# Patient Record
Sex: Female | Born: 1965 | Hispanic: Yes | Marital: Married | Smoking: Never smoker
Health system: Southern US, Community
[De-identification: ages and names within clinical notes are randomized; demographics above are authoritative.]

## PROBLEM LIST (undated history)

## (undated) DIAGNOSIS — I350 Nonrheumatic aortic (valve) stenosis: Secondary | ICD-10-CM

## (undated) DIAGNOSIS — Q244 Congenital subaortic stenosis: Secondary | ICD-10-CM

## (undated) DIAGNOSIS — I351 Nonrheumatic aortic (valve) insufficiency: Secondary | ICD-10-CM

## (undated) DIAGNOSIS — I4891 Unspecified atrial fibrillation: Secondary | ICD-10-CM

## (undated) DIAGNOSIS — I10 Essential (primary) hypertension: Secondary | ICD-10-CM

## (undated) HISTORY — DX: Nonrheumatic aortic (valve) insufficiency: I35.1

## (undated) HISTORY — DX: Congenital subaortic stenosis: Q24.4

## (undated) HISTORY — DX: Nonrheumatic aortic (valve) stenosis: I35.0

---

## 2015-05-27 ENCOUNTER — Emergency Department (HOSPITAL_COMMUNITY): Payer: Self-pay

## 2015-05-27 ENCOUNTER — Emergency Department (HOSPITAL_COMMUNITY)
Admission: EM | Admit: 2015-05-27 | Discharge: 2015-05-27 | Disposition: A | Discharge status: Home / Self Care | Payer: Self-pay | Attending: Emergency Medicine | Admitting: Emergency Medicine

## 2015-05-27 ENCOUNTER — Encounter (HOSPITAL_COMMUNITY): Payer: Self-pay | Admitting: Emergency Medicine

## 2015-05-27 DIAGNOSIS — I480 Paroxysmal atrial fibrillation: Secondary | ICD-10-CM

## 2015-05-27 DIAGNOSIS — I10 Essential (primary) hypertension: Secondary | ICD-10-CM

## 2015-05-27 DIAGNOSIS — Z79899 Other long term (current) drug therapy: Secondary | ICD-10-CM | POA: Insufficient documentation

## 2015-05-27 DIAGNOSIS — I4891 Unspecified atrial fibrillation: Secondary | ICD-10-CM

## 2015-05-27 HISTORY — DX: Essential (primary) hypertension: I10

## 2015-05-27 HISTORY — DX: Unspecified atrial fibrillation: I48.91

## 2015-05-27 LAB — I-STAT TROPONIN, ED: TROPONIN I, POC: 0.01 ng/mL (ref 0.00–0.08)

## 2015-05-27 LAB — URINALYSIS, ROUTINE W REFLEX MICROSCOPIC
BILIRUBIN URINE: NEGATIVE
Glucose, UA: NEGATIVE mg/dL
Ketones, ur: NEGATIVE mg/dL
Leukocytes, UA: NEGATIVE
NITRITE: NEGATIVE
PROTEIN: NEGATIVE mg/dL
SPECIFIC GRAVITY, URINE: 1.006 (ref 1.005–1.030)
pH: 7 (ref 5.0–8.0)

## 2015-05-27 LAB — CBC
HCT: 43.4 % (ref 36.0–46.0)
Hemoglobin: 14.5 g/dL (ref 12.0–15.0)
MCH: 28.4 pg (ref 26.0–34.0)
MCHC: 33.4 g/dL (ref 30.0–36.0)
MCV: 85.1 fL (ref 78.0–100.0)
Platelets: 242 10*3/uL (ref 150–400)
RBC: 5.1 MIL/uL (ref 3.87–5.11)
RDW: 13.1 % (ref 11.5–15.5)
WBC: 10.7 10*3/uL — AB (ref 4.0–10.5)

## 2015-05-27 LAB — I-STAT CHEM 8, ED
BUN: 14 mg/dL (ref 6–20)
CREATININE: 0.5 mg/dL (ref 0.44–1.00)
Calcium, Ion: 1.03 mmol/L — ABNORMAL LOW (ref 1.12–1.23)
Chloride: 104 mmol/L (ref 101–111)
GLUCOSE: 125 mg/dL — AB (ref 65–99)
HCT: 47 % — ABNORMAL HIGH (ref 36.0–46.0)
HEMOGLOBIN: 16 g/dL — AB (ref 12.0–15.0)
Potassium: 3.4 mmol/L — ABNORMAL LOW (ref 3.5–5.1)
Sodium: 141 mmol/L (ref 135–145)
TCO2: 22 mmol/L (ref 0–100)

## 2015-05-27 LAB — MAGNESIUM: Magnesium: 2.2 mg/dL (ref 1.7–2.4)

## 2015-05-27 LAB — BASIC METABOLIC PANEL
Anion gap: 12 (ref 5–15)
BUN: 11 mg/dL (ref 6–20)
CALCIUM: 8.4 mg/dL — AB (ref 8.9–10.3)
CO2: 22 mmol/L (ref 22–32)
CREATININE: 0.52 mg/dL (ref 0.44–1.00)
Chloride: 104 mmol/L (ref 101–111)
GFR calc Af Amer: >60 mL/min (ref >60)
Glucose, Bld: 127 mg/dL — ABNORMAL HIGH (ref 65–99)
POTASSIUM: 3.3 mmol/L — AB (ref 3.5–5.1)
SODIUM: 138 mmol/L (ref 135–145)

## 2015-05-27 LAB — URINE MICROSCOPIC-ADD ON

## 2015-05-27 LAB — TSH: TSH: 2.172 u[IU]/mL (ref 0.350–4.500)

## 2015-05-27 MED ORDER — POTASSIUM CHLORIDE CRYS ER 20 MEQ PO TBCR
40.0000 meq | EXTENDED_RELEASE_TABLET | Freq: Every day | ORAL | Status: DC
  Administered 2015-05-27: 40 meq via ORAL
  Filled 2015-05-27: qty 2

## 2015-05-27 MED ORDER — APIXABAN 5 MG PO TABS
5.0000 mg | ORAL_TABLET | Freq: Two times a day (BID) | ORAL | Status: DC
Start: 2015-05-27 — End: 2015-06-28

## 2015-05-27 MED ORDER — APIXABAN 5 MG PO TABS: 5.0000 mg | ORAL_TABLET | Freq: Two times a day (BID) | ORAL | Status: DC

## 2015-05-27 MED ORDER — POTASSIUM CHLORIDE CRYS ER 20 MEQ PO TBCR: 20.0000 meq | EXTENDED_RELEASE_TABLET | Freq: Every day | ORAL | Status: DC

## 2015-05-27 MED ORDER — DILTIAZEM HCL ER COATED BEADS 240 MG PO CP24: 240.0000 mg | ORAL_CAPSULE | Freq: Every day | ORAL | Status: DC

## 2015-05-27 MED ORDER — POTASSIUM CHLORIDE ER 10 MEQ PO TBCR
40.0000 meq | EXTENDED_RELEASE_TABLET | Freq: Once | ORAL | Status: AC
  Administered 2015-05-27: 40 meq via ORAL
  Filled 2015-05-27: qty 4

## 2015-05-27 MED ORDER — DILTIAZEM HCL 25 MG/5ML IV SOLN
15.0000 mg | Freq: Once | INTRAVENOUS | Status: AC
  Administered 2015-05-27: 15 mg via INTRAVENOUS

## 2015-05-27 MED ORDER — PROPOFOL 10 MG/ML IV BOLUS
0.5000 mg/kg | Freq: Once | INTRAVENOUS | Status: AC
  Administered 2015-05-27: 40 mg via INTRAVENOUS
  Filled 2015-05-27: qty 20

## 2015-05-27 MED ORDER — PROPOFOL 10 MG/ML IV BOLUS
INTRAVENOUS | Status: AC | PRN
  Administered 2015-05-27: 40 mg via INTRAVENOUS

## 2015-05-27 MED ORDER — DILTIAZEM HCL 100 MG IV SOLR
5.0000 mg/h | Freq: Once | INTRAVENOUS | Status: AC
  Administered 2015-05-27: 5 mg/h via INTRAVENOUS
  Filled 2015-05-27: qty 100

## 2015-05-27 MED ORDER — ENOXAPARIN SODIUM 80 MG/0.8ML ~~LOC~~ SOLN
70.0000 mg | Freq: Once | SUBCUTANEOUS | Status: AC
  Administered 2015-05-27: 70 mg via SUBCUTANEOUS
  Filled 2015-05-27: qty 0.8

## 2015-05-27 MED ORDER — METOPROLOL TARTRATE 1 MG/ML IV SOLN
5.0000 mg | Freq: Once | INTRAVENOUS | Status: AC
  Administered 2015-05-27: 5 mg via INTRAVENOUS
  Filled 2015-05-27: qty 5

## 2015-05-27 NOTE — ED Notes (Signed)
cardiology at bedside

## 2015-05-27 NOTE — Sedation Documentation (Signed)
Cardioverted at 150j by Dr. Oval Linsey cardiology. Pt tolerated well.

## 2015-05-27 NOTE — ED Notes (Signed)
Cardiology MD at bedside.

## 2015-05-27 NOTE — ED Notes (Signed)
Dr Dina Rich aware and at bedside for heart rate.

## 2015-05-27 NOTE — ED Provider Notes (Signed)
CSN: [id number]     Arrival date & time 05/27/15  0532 History   First MD Initiated Contact with Patient 05/27/15 0600     Chief Complaint  Patient presents with  . Chest Pain     (Consider location/radiation/quality/duration/timing/severity/associated sxs/prior Treatment) HPI  This is a 50 year old female with no reported past medical history who presents with chest pain and fullness in her throat. She also reports bilateral arm numbness. It was acute in onset at approximately 4 AM. No history of the same. She was noted in triage to have a heart rate in the 190s to 200s. She otherwise is hemodynamically stable. Blood pressure 166/144. Currently she feels like "I just have something in my throat." Denies any alcohol or drug use. Denies any excessive caffeine use. No history of coronary artery disease, diabetes, hypertension.  Level V caveat for acuity of condition  History reviewed. No pertinent past medical history. History reviewed. No pertinent past surgical history. No family history on file. Social History  Substance Use Topics  . Smoking status: Never Smoker   . Smokeless tobacco: None  . Alcohol Use: None   OB History    No data available     Review of Systems  Constitutional: Negative for fever.  Respiratory: Negative for shortness of breath.   Cardiovascular: Positive for chest pain. Negative for leg swelling.  Gastrointestinal: Negative for nausea, vomiting and abdominal pain.  All other systems reviewed and are negative.     Allergies  Review of patient's allergies indicates no known allergies.  Home Medications   Prior to Admission medications   Not on File   BP 130/104 mmHg  Pulse 104  Temp(Src) 97.7 F (36.5 C) (Oral)  Resp 25  SpO2 98% Physical Exam  Constitutional: She is oriented to person, place, and time. She appears well-developed and well-nourished. No distress.  HENT:  Head: Normocephalic and atraumatic.  Cardiovascular: Normal heart  sounds.   No murmur heard. Tachycardia, irregularly irregular  Pulmonary/Chest: Effort normal. No respiratory distress. She has no wheezes.  Abdominal: Soft. Bowel sounds are normal. There is no tenderness. There is no rebound.  Musculoskeletal: She exhibits no edema.  Neurological: She is alert and oriented to person, place, and time.  5 out of 5 strength bilateral upper and lower extremities, cranial nerves II through XII intact  Skin: Skin is warm and dry.  Psychiatric: She has a normal mood and affect.  Nursing note and vitals reviewed.   ED Course  Procedures (including critical care time)  CRITICAL CARE Performed by: Merryl Hacker   Total critical care time: 30 minutes  Critical care time was exclusive of separately billable procedures and treating other patients.  Critical care was necessary to treat or prevent imminent or life-threatening deterioration.  Critical care was time spent personally by me on the following activities: development of treatment plan with patient and/or surrogate as well as nursing, discussions with consultants, evaluation of patient's response to treatment, examination of patient, obtaining history from patient or surrogate, ordering and performing treatments and interventions, ordering and review of laboratory studies, ordering and review of radiographic studies, pulse oximetry and re-evaluation of patient's condition.  Labs Review Labs Reviewed  BASIC METABOLIC PANEL - Abnormal; Notable for the following:    Potassium 3.3 (*)    Glucose, Bld 127 (*)    Calcium 8.4 (*)    All other components within normal limits  CBC - Abnormal; Notable for the following:    WBC 10.7 (*)  All other components within normal limits  I-STAT CHEM 8, ED - Abnormal; Notable for the following:    Potassium 3.4 (*)    Glucose, Bld 125 (*)    Calcium, Ion 1.03 (*)    Hemoglobin 16.0 (*)    HCT 47.0 (*)    All other components within normal limits   MAGNESIUM  TSH  I-STAT TROPOININ, ED  Randolm Idol, ED    Imaging Review Dg Chest Portable 1 View  05/27/2015  CLINICAL DATA:  Chest pain and tachycardia.  Atrial fibrillation. EXAM: PORTABLE CHEST 1 VIEW COMPARISON:  None. FINDINGS: Shallow inspiration. EKG patch limits evaluation of the mediastinum. The heart size and mediastinal contours are within normal limits. Both lungs are clear. The visualized skeletal structures are unremarkable. IMPRESSION: No active disease. Electronically Signed   By: Lucienne Capers M.D.   On: 05/27/2015 06:33   I have personally reviewed and evaluated these images and lab results as part of my medical decision-making.   EKG Interpretation   Date/Time:  Friday May 27 2015 05:48:32 EDT Ventricular Rate:  186 PR Interval:    QRS Duration: 72 QT Interval:  226 QTC Calculation: 397 R Axis:   66 Text Interpretation:  Atrial fibrillation with rapid ventricular response  Marked ST abnormality, possible inferior subendocardial injury Marked ST  abnormality, possible anterior subendocardial injury Abnormal ECG  Confirmed by HORTON  MD, COURTNEY ([id number]) on 05/27/2015 6:00:39 AM      MDM   Final diagnoses:  Atrial fibrillation with rapid ventricular response [facility])    Patient presents in atrial fibrillation with RVR. Onset at approximately 4 AM. No known history. She is stable. Heart rates 180-200. She was given 15 mg of diltiazem with rates lowering into the 130s. Still in atrial fibrillation. She was placed on a diltiazem drip. Orders per atrial fibrillation pathway. Troponin is negative. Discussed with cardiology. They will advise regarding chemical versus electrical cardioversion.  Signed out to Dr. Ashok Cordia.    Merryl Hacker, MD 05/27/15 [phone]

## 2015-05-27 NOTE — Progress Notes (Signed)
Electrical Cardioversion Procedure Note Whitney Juarez [id number] 1966/02/04  Procedure: Electrical Cardioversion Indications:  Atrial Fibrillation  Procedure Details Consent: Risks of procedure as well as the alternatives and risks of each were explained to the (patient/caregiver).  Consent for procedure obtained. Time Out: Verified patient identification, verified procedure, site/side was marked, verified correct patient position, special equipment/implants available, medications/allergies/relevent history reviewed, required imaging and test results available.  Performed  Patient placed on cardiac monitor, pulse oximetry, supplemental oxygen as necessary.  Sedation given: Propofol via EM Attending Pacer pads placed anterior and posterior chest.  Cardioverted 1 time(s).  Cardioverted at 150J.  Evaluation Findings: Post procedure EKG shows: NSR Complications: None Patient did tolerate procedure well.   Skeet Latch , MD  05/27/2015, 10:19 AM

## 2015-05-27 NOTE — Consult Note (Addendum)
Patient ID: Whitney Juarez MRN: [id number] DOB/AGE: October 09, 1965 50 y.o.  Admit date: 05/27/2015 Primary Physician n/a Primary Cardiologist new Skeet Latch, MD)   Chief Complaint  Atrial fibrillation with rapid ventricular response.   HPI: Ms. Whitney Juarez is a 89F with hypertension who presents to the ED with atrial fibrillation with RVR.  On Wednesday afternoon she noted the sudden onset of slight chest discomfort.  Since that time she has noted mild shortness of breath with ambulation but denied palpitations.  This am she awakened at 4 am with palpitations and worsened chest discomfort, diaphoresis, and mild shortness of breath.  She denies lightheadedness or dizziness.  She also denies lower extremity edema, orthopnea or PND.  Troponin was 0.01.  She was started on a diltiazem infusion and her heart rates lowered to the 110s-140s.  She reports that her chest pressure is improved but still present.  She also continues to have palpitations.    Ms. Whitney Juarez reports a history of hypertension but has not been on any medications.  She does not have a primary care physician and does not have regular medical care.    Review of Systems: A 12 point review of systems was obtained and was negative with exceptions as noted in the HPI.  History reviewed. No pertinent past medical history.   (Not in a hospital admission)  Infusions:   No Known Allergies  Social History   Social History  . Marital Status: Married    Spouse Name: N/A  . Number of Children: N/A  . Years of Education: N/A   Occupational History  . Not on file.   Social History Main Topics  . Smoking status: Never Smoker   . Smokeless tobacco: Not on file  . Alcohol Use: Not on file  . Drug Use: Not on file  . Sexual Activity: Not on file   Other Topics Concern  . Not on file   Social History Narrative  . No narrative on file    No family history on file.  PHYSICAL EXAM: Filed Vitals:   05/27/15 0630 05/27/15 0715    BP: 138/89 130/104  Pulse: 125 104  Temp:    Resp: 24 25    No intake or output data in the 24 hours ending 05/27/15 0821  General:  Well appearing. No respiratory difficulty HEENT: normal Neck: supple. no JVD. Carotids 2+ bilat; no bruits. No lymphadenopathy or thryomegaly appreciated. Cor: PMI nondisplaced. Irregularly irregular.  No rubs, gallops or murmurs. Lungs: clear to auscultation bilaterally. Abdomen: soft, nontender, nondistended. No hepatosplenomegaly. No bruits or masses. Good bowel sounds. Extremities: no cyanosis, clubbing, rash, edema Neuro: alert & oriented x 3, cranial nerves grossly intact. moves all 4 extremities w/o difficulty. Affect pleasant.  Results for orders placed or performed during the hospital encounter of 05/27/15 (from the past 24 hour(s))  I-Stat Troponin, ED (not at [facility])     Status: None   Collection Time: 05/27/15  6:08 AM  Result Value Ref Range   Troponin i, poc 0.01 0.00 - 0.08 ng/mL   Comment 3          I-Stat Chem 8, ED     Status: Abnormal   Collection Time: 05/27/15  6:11 AM  Result Value Ref Range   Sodium 141 135 - 145 mmol/L   Potassium 3.4 (L) 3.5 - 5.1 mmol/L   Chloride 104 101 - 111 mmol/L   BUN 14 6 - 20 mg/dL   Creatinine, Ser 0.50 0.44 - 1.00  mg/dL   Glucose, Bld 125 (H) 65 - 99 mg/dL   Calcium, Ion 1.03 (L) 1.12 - 1.23 mmol/L   TCO2 22 0 - 100 mmol/L   Hemoglobin 16.0 (H) 12.0 - 15.0 g/dL   HCT 47.0 (H) 36.0 - 46.0 %  TSH     Status: None   Collection Time: 05/27/15  6:15 AM  Result Value Ref Range   TSH 2.172 0.350 - 4.500 uIU/mL  Basic metabolic panel     Status: Abnormal   Collection Time: 05/27/15  6:38 AM  Result Value Ref Range   Sodium 138 135 - 145 mmol/L   Potassium 3.3 (L) 3.5 - 5.1 mmol/L   Chloride 104 101 - 111 mmol/L   CO2 22 22 - 32 mmol/L   Glucose, Bld 127 (H) 65 - 99 mg/dL   BUN 11 6 - 20 mg/dL   Creatinine, Ser 0.52 0.44 - 1.00 mg/dL   Calcium 8.4 (L) 8.9 - 10.3 mg/dL   GFR calc non Af Amer  >60 >60 mL/min   GFR calc Af Amer >60 >60 mL/min   Anion gap 12 5 - 15  CBC     Status: Abnormal   Collection Time: 05/27/15  6:38 AM  Result Value Ref Range   WBC 10.7 (H) 4.0 - 10.5 K/uL   RBC 5.10 3.87 - 5.11 MIL/uL   Hemoglobin 14.5 12.0 - 15.0 g/dL   HCT 43.4 36.0 - 46.0 %   MCV 85.1 78.0 - 100.0 fL   MCH 28.4 26.0 - 34.0 pg   MCHC 33.4 30.0 - 36.0 g/dL   RDW 13.1 11.5 - 15.5 %   Platelets 242 150 - 400 K/uL  Magnesium     Status: None   Collection Time: 05/27/15  6:38 AM  Result Value Ref Range   Magnesium 2.2 1.7 - 2.4 mg/dL   Dg Chest Portable 1 View  05/27/2015  CLINICAL DATA:  Chest pain and tachycardia.  Atrial fibrillation. EXAM: PORTABLE CHEST 1 VIEW COMPARISON:  None. FINDINGS: Shallow inspiration. EKG patch limits evaluation of the mediastinum. The heart size and mediastinal contours are within normal limits. Both lungs are clear. The visualized skeletal structures are unremarkable. IMPRESSION: No active disease. Electronically Signed   By: Lucienne Capers M.D.   On: 05/27/2015 06:33    ECG: Atrial fibrillation rate 186. Inferolateral ST or pressure and T-wave inversion.   ASSESSMENT/PLAN:  # Paroxysmal atrial fibrillation with RVR: Ms. Whitney Juarez is here with new-onset atrial fibrillation.  She has a clear onset of either this AM at 4 am or possibly slower rates on Wednesday afternoon.  Either way, we are within the 48h window that is acceptable for DCCV without TEE.  We will give her a dose of weight-based lovenox this AM with plans to start Eliquis today.  TSH is normal and she is not anemic.  We will supplement potassium.  If she remains in sinus rhythm post DCCV, we will start diltiazem CD 240mg  daily and Eliquis 5mg  bid.  We will also give potassium 20 mEq po daily.  We will schedule follow up in atrial fibrillation clinic within 1 week.  She will need an echo as an outpatient.  This patients CHA2DS2-VASc Score and unadjusted Ischemic Stroke Rate (% per year) is equal to  2.2 % stroke rate/year from a score of 2  Above score calculated as 1 point each if present [CHF, HTN, DM, Vascular=MI/PAD/Aortic Plaque, Age if 65-74, or Female] Above score calculated as 2 points  each if present [Age > 75, or Stroke/TIA/TE]    # Hypertension:  Ms. Whitney Juarez has hypertension that is not treated.  We will start diltiazem as above.  Her blood pressure is well-controlled on the diltiazem infusion.  # Leukocytosis: Ms. Whitney Juarez does not have any infectious symptoms.  We will add on a U/A.   Signed: Nesiah Jump C. Oval Linsey, MD, [facility]  05/27/2015, 8:21 AM

## 2015-05-27 NOTE — Discharge Instructions (Signed)
It was our pleasure to provide your ER care today - we hope that you feel better.  Take eliquis (blood thinner medication) as prescribed. Use caution to avoid falling when taking blood thinner medication.  Take diltiazem (blood pressure and heart rate control medication) as prescribed.  Your potassium level is mildly low (3.3) - eat plenty of fruits and vegetables, take potassium supplement as prescribed, and follow up with your doctor for recheck in 1 week.  Follow up in the [facility] in the coming week.   Return to ER if worse, new symptoms, chest pain, trouble breathing, persistent fast heart beat, weak/fainting, other concern.  You were given sedating medication in the ER - no driving for the next 12 hours.     Fibrilacin auricular (Atrial Fibrillation) La fibrilacin auricular es un tipo de latido cardaco irregular o rpido (arritmia). En la fibrilacin auricular, el corazn tiembla continuamente en un patrn catico. Esto ocurre cuando regiones del corazn reciben seales desorganizadas que le impiden a este rgano bombear con normalidad. Esto puede incrementar el riesgo de ictus, insuficiencia cardaca y otras enfermedades relacionadas con el corazn. Hay diferentes tipos de fibrilacin auricular, entre ellos:  Fibrilacin auricular paroxstica. Este tipo de fibrilacin se presenta de Geographical information systems officer repentina y suele detenerse por s sola poco tiempo despus de haber comenzado.  Fibrilacin auricular persistente. Este tipo suele durar ms de [employer]. Puede detenerse por s sola o con tratamiento.  Fibrilacin auricular persistente de larga duracin. Este tipo dura ms de 91meses.  Fibrilacin auricular permanente. Este tipo no desaparece. Hable con el mdico para saber qu tipo de fibrilacin auricular es la que usted tiene. CAUSAS Las causas de esta afeccin son algunas enfermedades o procedimientos que guardan relacin con el corazn, e incluyen lo  siguiente:  Infarto de miocardio.  Enfermedad arterial coronaria.  Insuficiencia cardaca.  [place].  Hipertensin arterial.  Inflamacin de la membrana que rodea el corazn (pericarditis).  Ciruga cardaca.  Algunos tipos de trastornos del ritmo cardaco, como sndrome de Wolff-Parkinson-White. Algunas otras causas son las siguientes:  Neumona.  Tiene apnea obstructiva del sueo.  Obstruccin de una arteria en los pulmones (embolia pulmonar o EP).  Cncer de pulmn.  Enfermedad pulmonar crnica.  Problemas tiroideos, especialmente si la tiroides es hiperactiva (hipertiroidismo).  Cafena.  Consumo excesivo de alcohol o de drogas.  Uso de algunos medicamentos, incluidos ciertos descongestivos y pastillas para Horticulturist, commercial. A veces, la causa no se puede determinar. FACTORES DE RIESGO Es ms probable que esta afeccin se manifieste en:  [employer].  Los fumadores.  Las personas que tienen diabetes mellitus.  Las personas que tienen sobrepeso (obesidad).  Los atletas que [employer]. SNTOMAS Los sntomas de esta afeccin incluyen lo siguiente:  Sensacin de que el corazn late rpida o irregularmente.  Sensacin de Ecologist.  Falta de aire.  Sensacin repentina de desvanecimiento o debilidad.  Cansarse con facilidad durante la actividad fsica. En algunos casos no hay sntomas. DIAGNSTICO El mdico puede detectar la fibrilacin auricular al tomarle el pulso. Si se la detecta, esta afeccin se puede diagnosticar a travs de lo siguiente:  Un electrocardiograma (ECG).  Un estudio con monitor de Holter que [employer] patrones de los latidos cardacos durante un lapso de 24horas.  Un ecocardiograma transtorcico (ETT) para evaluar la circulacin de sangre en el corazn.  Un ecocardiograma transesofgico (ETE) para ver imgenes ms detalladas del corazn.  Una prueba de  esfuerzo.  Estudios de diagnstico por imgenes, como una tomografa computarizada (TC) o una radiografa de trax.  Anlisis de [place]. TRATAMIENTO Los principales objetivos del tratamiento son evitar la formacin de cogulos sanguneos y Theatre manager el corazn latiendo a una frecuencia y un ritmo normales. El tipo de tratamiento que se administra depende de muchos factores, por ejemplo, las enfermedades preexistentes y cmo se siente cuando tiene fibrilacin auricular. El tratamiento de esta afeccin puede incluir lo siguiente:  Medicamentos para Health and safety inspector frecuencia cardaca, normalizar el ritmo cardaco o evitar la formacin de cogulos sanguneos.  Cardioversin elctrica. Se trata de un procedimiento que restablece el ritmo cardaco al aplicar una descarga elctrica controlada de baja intensidad en el corazn a travs de la piel.  Diferentes tipos de ablacin, como la ablacin por catter, la ablacin por catter con marcapasos o la ablacin [place]. Estos procedimientos destruyen los tejidos del corazn que envan las seales anormales. Cuando se [place] un marcapasos, se lo coloca debajo de la piel para ayudar a que el corazn lata a un ritmo constante. Aurora los medicamentos de venta libre y los recetados solamente como se lo haya indicado el mdico.  Si el mdico le recet un anticoagulante, tmelo exactamente como se lo indic. Tomar dosis excesivas de anticoagulantes puede causar hemorragias. Si no toma las dosis suficientes de anticoagulantes, no tendr la proteccin necesaria contra el ictus y Educational psychologist.  No consuma productos que contengan tabaco, incluidos cigarrillos, tabaco de Higher education careers adviser y Psychologist, sport and exercise. Si necesita ayuda para dejar de fumar, consulte al mdico.  Si tiene apnea obstructiva del sueo, trate la afeccin como se lo haya indicado el mdico.  No beba alcohol.  No tome bebidas que contengan cafena, como  caf, refrescos y t.  Mantenga un peso saludable. No tome pastillas para adelgazar a menos que el mdico lo autorice. Estas pueden agravar los problemas cardacos.  Siga las indicaciones del mdico con respecto a Engineer, technical sales.  Haga actividad fsica habitualmente como se lo haya indicado el mdico.  Concurra a todas las visitas de control como se lo haya indicado el mdico. Esto es importante. PREVENCIN  No tome bebidas que contengan cafena o alcohol.  Evite tomar determinados medicamentos, especialmente los que se usan para los problemas respiratorios.  Evite tomar determinadas hierbas y [employer], por ejemplo, los que contienen efedra o ginseng.  No consuma drogas, como cocana y anfetaminas.  No fume.  Mantenga la hipertensin arterial bajo control. SOLICITE ATENCIN MDICA SI:  Nota un cambio en la frecuencia, el ritmo o la fuerza de los latidos cardacos.  Est tomando un anticoagulante y Cathleen Fears mayor cantidad de hematomas.  Se cansa ms fcilmente cuando hace actividad fsica o esfuerzos. SOLICITE ATENCIN MDICA DE INMEDIATO SI:  Tiene dolor de pecho, dolor abdominal, sudoracin o debilidad.  Siente nuseas.  Observa sangre en el vmito, en las heces o en la orina.  Le falta el aire.  Se le hinchan los pies y los tobillos repentinamente.  Siente mareos.  Siente debilidad o adormecimiento sbito de la cara, el brazo o la pierna, especialmente en un lado del cuerpo.  Tiene dificultad para hablar, para comprender o para ambas cosas (afasia).  El rostro o el prpado se le caen hacia un lado. Estos sntomas pueden representar un problema grave que constituye Engineer, maintenance (IT). No espere hasta que los sntomas desaparezcan. Solicite atencin mdica de inmediato. Comunquese con el servicio de emergencias de su localidad (911 en los [place]). No conduzca por sus propios medios [employer].   Esta informacin no tiene Marine scientist el  consejo del mdico. Asegrese de hacerle al mdico cualquier pregunta que tenga.   Document Released: 01/22/2005 Document Revised: 10/13/2014 Elsevier Interactive Patient Education 2016 [place] (Hypokalemia) Hipokalemia significa que el nivel de potasio en sangre es menor que lo normal. El potasio es un electrolito que ayuda a regular la cantidad de lquido del organismo. Tambin estimula la contraccin muscular y ayuda a que la funcin muscular sea la [place]. La Delorise Shiner del potasio del organismo se encuentra dentro de las clulas y slo una pequea cantidad en la sangre. Debido a que la cantidad en la sangre es muy pequea, pequeos cambios en la sangre pueden poner en peligro la vida. CAUSAS  Antibiticos.  Diarrea o vmitos.  El uso excesivo de laxantes, lo que puede causar diarrea.  Enfermedad renal crnica.  Uso de diurticos.  Trastornos de Youth worker (bulimia).  Bajos niveles de magnesio.  Sudoracin abundante. Excel.  Estreimiento.  Fatiga.  Calambres musculares.  Confusin mental.  Latidos cardacos salteados o irregulares (palpitaciones).  Hormigueo o adormecimiento. DIAGNSTICO  El mdico puede diagnosticar hipokalemia por los anlisis de [place]. Adems para controlar sus niveles de potasio, el mdico podr ordenar otros anlisis de laboratorio. TRATAMIENTO La hipokalemia puede tratarse con suplementos de potasio por va oral o realizando ajustes en sus medicamentos habituales. Si sus niveles de potasio son muy bajos, ser necesario que lo reciba a travs de una vena (IV) y se lo controle en el hospital. Ardelia Mems dieta rica en potasio tambin puede ser de [place]. Los alimentos ricos en potasio son:  Clayburn Pert secos, como cacahuetes y pistachos.  Semillas, como semillas de girasol y de Scientific laboratory technician.  Porotos, guisantes secos y lentejas.  Granos enteros y panes y cereales con salvado.  Lambert Mody y vegetales frescos  como damascos, avocado, bananas, meln, kiwi, naranjas, esprragos y patatas.  Jugos de naranja y tomates.  Carnes rojas.  Yogur con frutas. INSTRUCCIONES PARA EL CUIDADO EN EL HOGAR  Tome todos los [employer] indic el mdico.  Siga una dieta saludable e incluya alimentos nutritivos como frutas, vegetales, nueces, granos enteros y carnes [place].  Si est tomando laxantes, asegrese de seguir las instrucciones del envase. SOLICITE ATENCIN MDICA SI:  La debilidad empeora.  Siente que el corazn late fuerte o est acelerado.  Vomita o tiene diarrea.  Tiene problemas para mantener su nivel de glucosa en el rango normal. SOLICITE ATENCIN MDICA DE INMEDIATO SI:  Siente dolor en el pecho, le falta de aire o se siente mareado.  Vomita o tiene diarrea durante ms de 2 das.  Se desmaya. ASEGRESE DE QUE:   Comprende estas instrucciones.  Controlar su afeccin.  Recibir ayuda de inmediato si no mejora o si empeora.   Esta informacin no tiene Marine scientist el consejo del mdico. Asegrese de hacerle al mdico cualquier pregunta que tenga.   Document Released: 01/22/2005 Document Revised: 02/12/2014 Elsevier Interactive Patient Education [employer].

## 2015-05-27 NOTE — ED Notes (Signed)
EDP at bedside  

## 2015-05-27 NOTE — ED Notes (Signed)
Patient with chest pain, throat pain like she has something in it and left arm numbness, weakness.  No slurred speech at this time, no facial droop.  Patient is having some shortness of breath, no nausea or vomiting.

## 2015-05-27 NOTE — ED Provider Notes (Signed)
CSN: [id number]     Arrival date & time 05/27/15  0532 History   First MD Initiated Contact with Patient 05/27/15 0600     Chief Complaint  Patient presents with  . Chest Pain     (Consider location/radiation/quality/duration/timing/severity/associated sxs/prior Treatment) HPI  Past Medical History  Diagnosis Date  . Atrial fibrillation ([facility]) 05/27/2015  . Hypertension 05/27/2015   History reviewed. No pertinent past surgical history. No family history on file. Social History  Substance Use Topics  . Smoking status: Never Smoker   . Smokeless tobacco: None  . Alcohol Use: None   OB History    No data available     Review of Systems    Allergies  Review of patient's allergies indicates no known allergies.  Home Medications   Prior to Admission medications   Medication Sig Start Date End Date Taking? Authorizing Provider  apixaban (ELIQUIS) 5 MG TABS tablet Take 1 tablet (5 mg total) by mouth 2 (two) times daily. 05/27/15   Lajean Saver, MD  diltiazem (CARTIA XT) 240 MG 24 hr capsule Take 1 capsule (240 mg total) by mouth daily. 05/27/15   Lajean Saver, MD  potassium chloride SA (K-DUR,KLOR-CON) 20 MEQ tablet Take 1 tablet (20 mEq total) by mouth daily. 05/27/15   Lajean Saver, MD   BP 108/65 mmHg  Pulse 82  Temp(Src) 97.7 F (36.5 C) (Oral)  Resp 27  Wt 68.04 kg  SpO2 98% Physical Exam  ED Course  .Cardioversion Date/Time: 05/27/2015 10:59 AM Performed by: Lajean Saver Authorized by: Lajean Saver Consent: Verbal consent obtained. Written consent obtained. Risks and benefits: risks, benefits and alternatives were discussed Consent given by: patient Patient identity confirmed: verbally with patient and arm band Time out: Immediately prior to procedure a "time out" was called to verify the correct patient, procedure, equipment, support staff and site/side marked as required. Patient sedated: yes Sedation type: moderate (conscious) sedation Sedatives:  propofol Pre-procedure rhythm: atrial fibrillation Patient position: patient was placed in a supine position Electrodes: pads Number of attempts: 1 Attempt 1 mode: synchronous Attempt 1 shock (in Joules): 150 Attempt 1 outcome: conversion to normal sinus rhythm Post-procedure rhythm: normal sinus rhythm Complications: no complications Patient tolerance: Patient tolerated the procedure well with no immediate complications  .Sedation Date/Time: 05/27/2015 11:01 AM Performed by: Lajean Saver Authorized by: Lajean Saver  Consent:    Consent obtained:  Written and verbal   Consent given by:  Patient Indications:    Sedation purpose:  Cardioversion   Procedure necessitating sedation performed by:  Different physician   Intended level of sedation:  Deep Pre-sedation assessment:    Time since last food or drink:  4 hours   ASA classification: class 2 - patient with mild systemic disease     Pre-sedation assessments completed and reviewed: airway patency     Pre-sedation assessment completed:  05/27/2015 9:00 AM Immediate pre-procedure details:    Reassessment: Patient reassessed immediately prior to procedure     Reviewed: vital signs, relevant labs/tests and NPO status     Verified: bag valve mask available, emergency equipment available, intubation equipment available, IV patency confirmed and oxygen available   Procedure details (see MAR for exact dosages):    Sedation start time:  05/27/2015 10:05 AM   Preoxygenation:  Nonrebreather mask   Sedation:  Propofol   Intra-procedure monitoring:  Blood pressure monitoring, cardiac monitor, continuous pulse oximetry, frequent LOC assessments and frequent vital sign checks   Intra-procedure events: none  Sedation end time:  05/27/2015 10:20 AM Post-procedure details:    Post-sedation assessment completed:  05/27/2015 10:30 AM   Attendance: Constant attendance by certified staff until patient recovered     Recovery: Patient returned to  pre-procedure baseline     Post-sedation assessments completed and reviewed: airway patency, cardiovascular function, mental status and respiratory function     Patient is stable for discharge or admission: Yes     Patient tolerance:  Tolerated well, no immediate complications  (including critical care time) Labs Review   EKG Interpretation   Date/Time:  Friday May 27 2015 10:14:17 EDT Ventricular Rate:  70 PR Interval:  142 QRS Duration: 77 QT Interval:  373 QTC Calculation: 402 R Axis:   52 Text Interpretation:  Sinus rhythm Minimal ST depression, inferior leads  Confirmed by Ashok Cordia  MD, Lennette Bihari ([id number]) on 05/27/2015 10:25:52 AM      MDM   Refer to Dr Mardee Postin full note for H and P.  Signed out by Dr Dina Rich that cardiology would come to ED and plan cardioversion.  That afib is acute onset, and < 48 hours in duration.    Cardiology confirms afib < 48 hours, acute onset.  They recommend cardioversion in ED.    Procedural sedation and cardioversion explained to patient by cardiologist, and written consent obtained.   Cardiology requests a single dose of lovenox sq in ED, and then starting on eliquis 5 mg bid, and cardizem 240 mg a day, and they will arrange close follow up in afib clinic.   lovenox sq.  After cardizem bolus, gtt, and dose of metoprolol for rate control, pt remains in afib.     Lajean Saver, MD 05/27/15 1105

## 2015-05-27 NOTE — Progress Notes (Signed)
ANTICOAGULATION CONSULT NOTE - Initial Consult  Pharmacy Consult for apixaban Indication: atrial fibrillation  No Known Allergies  Patient Measurements: Weight: 150 lb (68.04 kg)  Vital Signs: Temp: 97.7 F (36.5 C) (04/21 0545) Temp Source: Oral (04/21 0545) BP: 129/88 mmHg (04/21 0845) Pulse Rate: 104 (04/21 0715)  Labs:  Recent Labs  05/27/15 0611 05/27/15 0638  HGB 16.0* 14.5  HCT 47.0* 43.4  PLT  --  242  CREATININE 0.50 0.52    CrCl cannot be calculated (Unknown ideal weight.).   Medical History: History reviewed. No pertinent past medical history.   Assessment: 36 yof presented to the ED with new onset afib. She received a 1x dose of therapeutic lovenox this AM. Now to start apixaban. Baseline H/H and platelets are WNL and pt has good renal function.   Goal of Therapy:  Therapeutic anticoagulation for stroke prevention Monitor platelets by anticoagulation protocol: Yes   Plan:  - Apixaban 5mg  PO BID starting tonight - F/u S&S of bleeding, renal fxn  Dorrene Bently, Rande Lawman 05/27/2015,9:20 AM

## 2015-05-31 ENCOUNTER — Encounter (HOSPITAL_COMMUNITY): Payer: Self-pay | Admitting: Nurse Practitioner

## 2015-05-31 ENCOUNTER — Ambulatory Visit (HOSPITAL_COMMUNITY)
Admit: 2015-05-31 | Discharge: 2015-05-31 | Disposition: A | Discharge status: Home / Self Care | Payer: Self-pay | Attending: Nurse Practitioner | Admitting: Nurse Practitioner

## 2015-05-31 VITALS — BP 140/68 | HR 85 | Ht 62.0 in | Wt 151.6 lb

## 2015-05-31 DIAGNOSIS — I4891 Unspecified atrial fibrillation: Secondary | ICD-10-CM | POA: Insufficient documentation

## 2015-05-31 DIAGNOSIS — I10 Essential (primary) hypertension: Secondary | ICD-10-CM | POA: Insufficient documentation

## 2015-05-31 DIAGNOSIS — I480 Paroxysmal atrial fibrillation: Secondary | ICD-10-CM

## 2015-05-31 DIAGNOSIS — Z0189 Encounter for other specified special examinations: Secondary | ICD-10-CM | POA: Insufficient documentation

## 2015-05-31 MED ORDER — DILTIAZEM HCL ER COATED BEADS 240 MG PO CP24: 240.0000 mg | ORAL_CAPSULE | Freq: Every day | ORAL | Status: DC

## 2015-05-31 NOTE — Progress Notes (Addendum)
Patient ID: Whitney Juarez, female   DOB: December 31, 1965, 50 y.o.   MRN: [id number]     Primary Care Physician: No PCP Per Patient Referring Physician: [facility] ER   Whitney Juarez is a 50 y.o. hispanic female with a h/o new onset afib with rvr that was seen in the early am hours of 4/21. She was successively  cardioverted since she had some to the ER within a few hours onset of afib. She is in the afib clinic for f/u. She reports no further issues with afib.She is taking the blood thinner without fail and will take for 30 days and stop with  chadsvasc of 1 for female.She will continue on cardizem. No bleeding issues.  Lifestyle risk factors reviewed. She is here with her son who helps to interpret. She does speak some [place]. She does snore but does not have a lot of s/s of sleep apnea. 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 No alcohol, smoking or excessive caffeine. She weighed 180 one year ago but has managed to reduce her weight to her current 150 lbs by modifying diet.  Today, she denies symptoms of palpitations, chest pain, shortness of breath, orthopnea, PND, lower extremity edema, dizziness, presyncope, syncope, or neurologic sequela. The patient is tolerating medications without difficulties and is otherwise without complaint today.   Past Medical History  Diagnosis Date  . Atrial fibrillation ([place]) 05/27/2015  . Hypertension 05/27/2015   No past surgical history on file.  Current Outpatient Prescriptions  Medication Sig Dispense Refill  . apixaban (ELIQUIS) 5 MG TABS tablet Take 1 tablet (5 mg total) by mouth 2 (two) times daily. 60 tablet 0  . diltiazem (CARTIA XT) 240 MG 24 hr capsule Take 1 capsule (240 mg total) by mouth daily. 30 capsule 6  . potassium chloride SA (K-DUR,KLOR-CON) 20 MEQ tablet Take 1 tablet (20 mEq total) by mouth daily. 10 tablet 0   No current facility-administered medications for this encounter.    No Known Allergies  Social History   Social History    . Marital Status: Married    Spouse Name: N/A  . Number of Children: N/A  . Years of Education: N/A   Occupational History  . Not on file.   Social History Main Topics  . Smoking status: Never Smoker   . Smokeless tobacco: Not on file  . Alcohol Use: Not on file  . Drug Use: Not on file  . Sexual Activity: Not on file   Other Topics Concern  . Not on file   Social History Narrative    No family history on file.  ROS- All systems are reviewed and negative except as per the HPI above  Physical Exam: Filed Vitals:   05/31/15 1437  BP: 140/68  Pulse: 85  Height: 5\' 2"  (1.575 m)  Weight: 151 lb 9.6 oz (68.765 kg)    GEN- The patient is well appearing, alert and oriented x 3 today.   Head- normocephalic, atraumatic Eyes-  Sclera clear, conjunctiva pink Ears- hearing intact Oropharynx- clear Neck- supple, no JVP Lymph- no cervical lymphadenopathy Lungs- Clear to ausculation bilaterally, normal work of breathing Heart- Regular rate and rhythm, 2/6 sys murmur,no rubs or gallops, PMI not laterally displaced GI- soft, NT, ND, + BS Extremities- no clubbing, cyanosis, or edema MS- no significant deformity or atrophy Skin- no rash or lesion Psych- euthymic mood, full affect Neuro- strength and sensation are intact  EKG-NSR at 85 bpm, normal Ekg  Assessment and Plan:  1. New onset afib Successfully cardioverted  Continue Doac x 30 days and stop for chadsvasc of 1 Continue diltiazem 240 mg a day and will have pt f/u in one  She needs echo for presence of murmur, will check with SW if she knows means for assistance to pay for this Can return to work  2,. Lifestyle risk factors Encouraged to continue weight reduction Establish regular exercise Possible sleep apnea but pt does not have insurance to cover Again will confer with SW   F/u in one month  Whitney Juarez, [facility] [address] [place], [place]  [address] [phone]   Encouraged continued weight loss and to sleep on side  Addendum- Whitney Juarez, SW, discussed with son to go to [facility] and apply for orange card and other available assistance. The son said that he would follow advise. Once he has done this then we can discuss echo/SS at f/u.

## 2015-06-01 ENCOUNTER — Telehealth: Payer: Self-pay | Admitting: Licensed Clinical Social Worker

## 2015-06-01 NOTE — Telephone Encounter (Signed)
CSW received referral from A-Fib clinic to assist patient with information about insurance options. CSW asked to contact patient's son to discuss options. Patient's son reports that patient works part time in housekeeping at [employer]. CSW discussed [place] at [employer]. Social Services in [place]. Son verbalizes understanding of follow up needed and will assist patient with application process. CSW available as needed. [name], LCSW [phone]

## 2015-06-02 NOTE — Addendum Note (Signed)
Encounter addended by: Sherran Needs, NP on: 06/02/2015 12:15 PM<BR>     Documentation filed: Notes Section

## 2015-06-28 ENCOUNTER — Ambulatory Visit (HOSPITAL_COMMUNITY)
Admission: RE | Admit: 2015-06-28 | Discharge: 2015-06-28 | Disposition: A | Discharge status: Home / Self Care | Payer: Managed Care, Other (non HMO) | Source: Ambulatory Visit | Attending: Nurse Practitioner | Admitting: Nurse Practitioner

## 2015-06-28 ENCOUNTER — Encounter (HOSPITAL_COMMUNITY): Payer: Self-pay | Admitting: Nurse Practitioner

## 2015-06-28 VITALS — BP 140/90 | HR 69 | Ht 62.0 in | Wt 149.0 lb

## 2015-06-28 DIAGNOSIS — Z79899 Other long term (current) drug therapy: Secondary | ICD-10-CM | POA: Insufficient documentation

## 2015-06-28 DIAGNOSIS — I10 Essential (primary) hypertension: Secondary | ICD-10-CM | POA: Insufficient documentation

## 2015-06-28 DIAGNOSIS — R9431 Abnormal electrocardiogram [ECG] [EKG]: Secondary | ICD-10-CM | POA: Insufficient documentation

## 2015-06-28 DIAGNOSIS — I480 Paroxysmal atrial fibrillation: Secondary | ICD-10-CM

## 2015-06-28 DIAGNOSIS — I4891 Unspecified atrial fibrillation: Secondary | ICD-10-CM | POA: Diagnosis not present

## 2015-06-28 NOTE — Progress Notes (Signed)
Patient ID: Whitney Juarez, female   DOB: Jan 05, 1966, 50 y.o.   MRN: [id number]     Primary Care Physician: No PCP Per Patient Referring Physician: [facility]   Whitney Juarez is a 50 y.o. hispanic female with a h/o new onset afib with rvr that was seen in the early am hours of 4/21. She was successively  cardioverted since she had some to the ER within a few hours onset of afib. She is in the afib clinic for f/u. She reports no further issues with afib.She is taking the blood thinner without fail and will take for 30 days and stop with  chadsvasc of 1 for female.She will continue on cardizem. No bleeding issues.  Lifestyle risk factors reviewed. She is here with her son who helps to interpret. She does speak some [place]. She does snore but does not have a lot of s/s of sleep apnea. 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 No alcohol, smoking or excessive caffeine. She weighed 180 one year ago but has managed to reduce her weight to her current 150 lbs by modifying diet. He was encouraged on last visit to see about getting mother some type of health insurance, per SW asked to look into the orange card.  She returns to afib clinic 5/23, no further afib issues. Has finished DOAC. The son acts as Veterinary surgeon and states that his father who has insurance has now covered the pt under his Buyer, retail.   Today, she denies symptoms of palpitations, chest pain, shortness of breath, orthopnea, PND, lower extremity edema, dizziness, presyncope, syncope, or neurologic sequela. The patient is tolerating medications without difficulties and is otherwise without complaint today.   Past Medical History  Diagnosis Date  . Atrial fibrillation ([place]) 05/27/2015  . Hypertension 05/27/2015   No past surgical history on file.  Current Outpatient Prescriptions  Medication Sig Dispense Refill  . diltiazem (CARTIA XT) 240 MG 24 hr capsule Take 1 capsule (240 mg total) by mouth daily. 30 capsule 6   No current  facility-administered medications for this encounter.    No Known Allergies  Social History   Social History  . Marital Status: Married    Spouse Name: N/A  . Number of Children: N/A  . Years of Education: N/A   Occupational History  . Not on file.   Social History Main Topics  . Smoking status: Never Smoker   . Smokeless tobacco: Not on file  . Alcohol Use: Not on file  . Drug Use: Not on file  . Sexual Activity: Not on file   Other Topics Concern  . Not on file   Social History Narrative    No family history on file.  ROS- All systems are reviewed and negative except as per the HPI above  Physical Exam: Filed Vitals:   06/28/15 1049  BP: 140/90  Pulse: 69  Height: 5\' 2"  (1.575 m)  Weight: 149 lb (67.586 kg)    GEN- The patient is well appearing, alert and oriented x 3 today.   Head- normocephalic, atraumatic Eyes-  Sclera clear, conjunctiva pink Ears- hearing intact Oropharynx- clear Neck- supple, no JVP Lymph- no cervical lymphadenopathy Lungs- Clear to ausculation bilaterally, normal work of breathing Heart- Regular rate and rhythm, 2/6 sys murmur,no rubs or gallops, PMI not laterally displaced GI- soft, NT, ND, + BS Extremities- no clubbing, cyanosis, or edema MS- no significant deformity or atrophy Skin- no rash or lesion Psych- euthymic mood, full affect Neuro- strength  and sensation are intact  EKG-NSR at 69 bpm, pr int 130 ms, qrs int 86 ms, qtc 432 ms Epic records reviewed  Assessment and Plan: 1. New onset afib Successfully cardioverted  Finished  Doac x 30 days and stop for chadsvasc of 1 Continue diltiazem 240 mg a day  Echo ordered for murmur  2,. Lifestyle risk factors Encouraged to continue weight reduction Establish regular exercise Will hold off sleep study for now with symptoms minimal for snoring    F/u after echo  Butch Penny C. Romin Divita, [facility] [address] [place], [place]  [address] [phone]

## 2015-06-28 NOTE — Addendum Note (Signed)
Encounter addended by: Sherran Needs, NP on: 06/28/2015  1:19 PM<BR>     Documentation filed: Follow-up Section, LOS Section

## 2015-07-11 ENCOUNTER — Ambulatory Visit (HOSPITAL_COMMUNITY)
Admission: RE | Admit: 2015-07-11 | Discharge: 2015-07-11 | Disposition: A | Discharge status: Home / Self Care | Payer: Managed Care, Other (non HMO) | Source: Ambulatory Visit | Attending: Nurse Practitioner | Admitting: Nurse Practitioner

## 2015-07-11 DIAGNOSIS — I5189 Other ill-defined heart diseases: Secondary | ICD-10-CM | POA: Insufficient documentation

## 2015-07-11 DIAGNOSIS — I4891 Unspecified atrial fibrillation: Secondary | ICD-10-CM | POA: Diagnosis present

## 2015-07-11 DIAGNOSIS — I517 Cardiomegaly: Secondary | ICD-10-CM | POA: Insufficient documentation

## 2015-07-11 DIAGNOSIS — I272 Other secondary pulmonary hypertension: Secondary | ICD-10-CM | POA: Insufficient documentation

## 2015-07-11 DIAGNOSIS — I480 Paroxysmal atrial fibrillation: Secondary | ICD-10-CM | POA: Insufficient documentation

## 2015-07-11 DIAGNOSIS — I351 Nonrheumatic aortic (valve) insufficiency: Secondary | ICD-10-CM | POA: Insufficient documentation

## 2015-07-11 LAB — ECHOCARDIOGRAM COMPLETE
AO mean calculated velocity dopler: 288 cm/s
AV Area VTI index: 0.46 cm2/m2
AV Area VTI: 0.85 cm2
AV Area mean vel: 0.73 cm2
AV Mean grad: 37 mmHg
AV Peak grad: 57 mmHg
AV VEL mean LVOT/AV: 0.32
AV area mean vel ind: 0.43 cm2/m2
AV peak Index: 0.5
AV pk vel: 379 cm/s
AV vel: 0.77
Ao pk vel: 0.37 m/s
E decel time: 285 msec
E/e' ratio: 10.09
FS: 46 % — AB (ref 28–44)
IVS/LV PW RATIO, ED: 0.93
LA ID, A-P, ES: 43 cm
LA diam end sys: 43 cm
LA diam index: 2.54 cm/m2
LA vol A4C: 78.9 ml
LA vol index: 43.4 mL/m2
LA vol: 73.4 cm3
LV E/e' medial: 10.09
LV E/e'average: 10.09
LV PW d: 10.5 mm — AB (ref 0.6–1.1)
LV dias vol index: 46 mL/m2
LV dias vol: 78 mL (ref 46–106)
LV e' LATERAL: 11.6 cm/s
LV sys vol index: 17 mL/m2
LV sys vol: 28 mL (ref 14–42)
LVOT SV: 76 cm3
LVOT VTI: 33.4 cm
LVOT area: 2.27 cm2
LVOT diameter: 17 mm
LVOT peak VTI: 0.34 cm
LVOT peak grad rest: 8 mmHg
LVOT peak vel: 142 cm/s
MV Dec: 285
MV Peak grad: 5 mmHg
MV pk A vel: 93.6 m/s
MV pk E vel: 117 m/s
P 1/2 time: 356 ms
Reg peak vel: 312 cm/s
Simpson's disk: 64
Stroke v: 50 ml
TDI e' lateral: 11.6
TDI e' medial: 8.27
TR max vel: 312 m/s
VTI: 98.1 cm
Valve area index: 0.46
Valve area: 0.77 cm2

## 2015-07-11 NOTE — Progress Notes (Signed)
Echocardiogram 2D Echocardiogram has been performed.  Tresa Res 07/11/2015, 3:24 PM

## 2015-07-18 ENCOUNTER — Encounter (HOSPITAL_COMMUNITY): Payer: Self-pay | Admitting: Nurse Practitioner

## 2015-07-18 ENCOUNTER — Ambulatory Visit (HOSPITAL_COMMUNITY)
Admission: RE | Admit: 2015-07-18 | Discharge: 2015-07-18 | Disposition: A | Discharge status: Home / Self Care | Payer: Managed Care, Other (non HMO) | Source: Ambulatory Visit | Attending: Nurse Practitioner | Admitting: Nurse Practitioner

## 2015-07-18 VITALS — BP 164/96 | HR 70 | Ht 62.0 in | Wt 147.6 lb

## 2015-07-18 DIAGNOSIS — Z79899 Other long term (current) drug therapy: Secondary | ICD-10-CM | POA: Diagnosis not present

## 2015-07-18 DIAGNOSIS — Z791 Long term (current) use of non-steroidal anti-inflammatories (NSAID): Secondary | ICD-10-CM | POA: Insufficient documentation

## 2015-07-18 DIAGNOSIS — I4891 Unspecified atrial fibrillation: Secondary | ICD-10-CM | POA: Diagnosis present

## 2015-07-18 DIAGNOSIS — I272 Other secondary pulmonary hypertension: Secondary | ICD-10-CM | POA: Diagnosis not present

## 2015-07-18 DIAGNOSIS — I350 Nonrheumatic aortic (valve) stenosis: Secondary | ICD-10-CM | POA: Diagnosis not present

## 2015-07-18 DIAGNOSIS — R011 Cardiac murmur, unspecified: Secondary | ICD-10-CM | POA: Insufficient documentation

## 2015-07-18 DIAGNOSIS — I10 Essential (primary) hypertension: Secondary | ICD-10-CM | POA: Diagnosis not present

## 2015-07-18 NOTE — Progress Notes (Signed)
Patient ID: Whitney Juarez, female   DOB: March 13, 1965, 50 y.o.   MRN: [id number]     Primary Care Physician: No PCP Per Patient Referring Physician: [facility] Cardiologist: Dr. Marisue Humble appointment pending 6/19)   Whitney Juarez is a 49 y.o. hispanic female with a h/o new onset afib with rvr that was seen in the early am hours of 4/21. She was successively  cardioverted since she had some to the ER within a few hours onset of afib. She is in the afib clinic for f/u. She reports no further issues with afib.She is taking the blood thinner without fail and will take for 30 days and stop with  chadsvasc of 1 for female.She will continue on cardizem. No bleeding issues.  Lifestyle risk factors reviewed. She is here with her son who helps to interpret. She does speak some [place]. She does snore but does not have a lot of s/s of sleep apnea. 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 She weighed 180 one year ago but has managed to reduce her weight to her current 147 lbs by modifying diet. He was encouraged on last visit to see about getting mother some type of health insurance, per SW asked to look into the orange card.  She returns to afib clinic 5/23, no further afib issues. Has finished DOAC. The son acts as Veterinary surgeon and states that his father who has insurance has now covered the pt under his Buyer, retail. Murmur is present and echo is pending.   Returns 6/12. Son acts as Veterinary surgeon. No afib episodes that she is aware of. Echo showed presence of mod to severe AS and TEE recommended. She has been scheduled with Dr. Johnsie Cancel for further evaluation. Currently she appears to be asymptomatic as fas as dyspnea with exertion, chest pain, presyncope is concerned.  Today, she denies symptoms of palpitations, chest pain, shortness of breath, orthopnea, PND, lower extremity edema, dizziness, presyncope, syncope, or neurologic sequela. The patient is tolerating medications without  difficulties and is otherwise without complaint today.   Past Medical History  Diagnosis Date  . Atrial fibrillation ([place]) 05/27/2015  . Hypertension 05/27/2015   No past surgical history on file.  Current Outpatient Prescriptions  Medication Sig Dispense Refill  . diltiazem (CARTIA XT) 240 MG 24 hr capsule Take 1 capsule (240 mg total) by mouth daily. 30 capsule 6  . ibuprofen (ADVIL,MOTRIN) 100 MG tablet Take 100 mg by mouth every 6 (six) hours as needed for fever.     No current facility-administered medications for this encounter.    No Known Allergies  Social History   Social History  . Marital Status: Married    Spouse Name: N/A  . Number of Children: N/A  . Years of Education: N/A   Occupational History  . Not on file.   Social History Main Topics  . Smoking status: Never Smoker   . Smokeless tobacco: Not on file  . Alcohol Use: Not on file  . Drug Use: Not on file  . Sexual Activity: Not on file   Other Topics Concern  . Not on file   Social History Narrative    No family history on file.  ROS- All systems are reviewed and negative except as per the HPI above  Physical Exam: Filed Vitals:   07/18/15 0954  BP: 164/96  Pulse: 70  Height: 5\' 2"  (1.575 m)  Weight: 147 lb 9.6 oz (66.951 kg)    GEN- The patient is well  appearing, alert and oriented x 3 today.   Head- normocephalic, atraumatic Eyes-  Sclera clear, conjunctiva pink Ears- hearing intact Oropharynx- clear Neck- supple, no JVP Lymph- no cervical lymphadenopathy Lungs- Clear to ausculation bilaterally, normal work of breathing Heart- Regular rate and rhythm, 2/6 sys murmur,no rubs or gallops, PMI not laterally displaced GI- soft, NT, ND, + BS Extremities- no clubbing, cyanosis, or edema MS- no significant deformity or atrophy Skin- no rash or lesion Psych- euthymic mood, full affect Neuro- strength and sensation are intact  EKG-NSR at 70 bpm, Pr int 146 ms, Qrs int 78 ms, Qtc 397  ms  Echo- Study Conclusions 07/11/15  - Left ventricle: The cavity size was normal. Wall thickness was  normal. Systolic function was normal. The estimated ejection  fraction was in the range of 60% to 65%. Wall motion was normal;  there were no regional wall motion abnormalities. Features are  consistent with a pseudonormal left ventricular filling pattern,  with concomitant abnormal relaxation and increased filling  pressure (grade 2 diastolic dysfunction). - Aortic valve: Poorly visualized. There was mild regurgitation.  Mean gradient (S): 37 mm Hg. Peak gradient (S): 57 mm Hg. Valve  area (VTI): 0.77 cm^2. - Mitral valve: There was no significant regurgitation. - Left atrium: The atrium was moderately dilated. - Right ventricle: The cavity size was normal. Systolic function  was normal. - Tricuspid valve: Peak RV-RA gradient (S): 39 mm Hg. - Pulmonary arteries: PA peak pressure: 42 mm Hg (S). - Inferior vena cava: The vessel was normal in size. The  respirophasic diameter changes were in the normal range (>= 50%),  consistent with normal central venous pressure.  Impressions:  - Normal LV size and systolic function, EF [id number]. Moderate  diastolic dysfunction. Normal RV size and systolic function. The  aortic valve was poorly visualized. It appeared to open well, but  mean gradient across the valve was 37 mmHg with calculated AVA  0.77 cm^2. This suggests moderate to severe AS. There was mild  AI. Would consider TEE for better view of aortic valve and  environs. Mild pulmonary hypertension. Epic records reviewed  Assessment and Plan: 1. New onset afib Successfully cardioverted 05/27/15 Finished  Doac x 30 days and stopped for chadsvasc of 1(female) Continue diltiazem 240 mg a day  Echo ordered for murmur  2,. Lifestyle risk factors Encouraged to continue weight reduction Establish regular exercise Will hold off sleep study for now with symptoms minimal  for snoring   3. Aortic Stenosis Mod to severe, appears aysmptomatic Establish with Dr. Johnsie Cancel for further evaluation and f/u   afib clinic as needed  Butch Penny C. Gwenevere Goga, [facility] [address] [place], [place] [address] [phone]

## 2015-07-25 ENCOUNTER — Encounter: Payer: Self-pay | Admitting: Cardiovascular Disease

## 2015-07-25 ENCOUNTER — Ambulatory Visit (INDEPENDENT_AMBULATORY_CARE_PROVIDER_SITE_OTHER): Payer: Managed Care, Other (non HMO) | Admitting: Cardiovascular Disease

## 2015-07-25 VITALS — BP 130/80 | HR 75 | Ht 59.0 in | Wt 148.1 lb

## 2015-07-25 DIAGNOSIS — I350 Nonrheumatic aortic (valve) stenosis: Secondary | ICD-10-CM | POA: Diagnosis not present

## 2015-07-25 NOTE — Patient Instructions (Signed)
Medication Instructions:  Your physician recommends that you continue on your current medications as directed. Please refer to the Current Medication list given to you today.   Labwork: None Ordered   Testing/Procedures: Your physician has requested that you have an echocardiogram in 6 months before your appointment with Dr. Oval Linsey. Echocardiography is a painless test that uses sound waves to create images of your heart. It provides your doctor with information about the size and shape of your heart and how well your heart's chambers and valves are working. This procedure takes approximately one hour. There are no restrictions for this procedure.   Follow-Up: Your physician wants you to follow-up in: 6 months with Dr. Oval Linsey after your echocardiogram.  You will receive a reminder letter in the mail two months in advance. If you don't receive a letter, please call our office to schedule the follow-up appointment.   If you need a refill on your cardiac medications before your next appointment, please call your pharmacy.   Thank you for choosing [facility]! Christen Bame, RN [phone]

## 2015-07-25 NOTE — Progress Notes (Signed)
Cardiology Office Note   Date:  07/25/2015   ID:  Whitney Juarez, DOB 1965-08-29, MRN [id number]  PCP:  No PCP Per Patient  Cardiologist:   Jenkins Rouge, MD   Chief Complaint  Patient presents with  . Aortic Stenosis      History of Present Illness: Whitney Juarez is a 50 y.o. female who presents for evaluation of AS  Had new onset afib with rvr seen in the early am hours of 4/21. She was successively cardioverted  Chadsvasc of 1 for female.She will continue on cardizem. DOAC stopped After 30 days .  Lifestyle risk factors reviewed. She is here with her son who helps to interpret. She does speak some [place]. She does snore but does not have a lot of s/s of sleep apnea. 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 She weighed 180 one year ago but has managed to reduce her weight to her current 147 lbs by modifying diet. He was encouraged on last visit to see about getting mother some type of health insurance, per SW asked to look into the orange card.  Murmur noted by Doristine Devoid in f/u with afib clinic.   Echo 07/11/15   Impressions:  - Normal LV size and systolic function, EF 123456. Moderate  diastolic dysfunction. Normal RV size and systolic function. The  aortic valve was poorly visualized. It appeared to open well, but  mean gradient across the valve was 37 mmHg with calculated AVA  0.77 cm^2. This suggests moderate to severe AS. There was mild  AI. Would consider TEE for better view of aortic valve and  environs. Mild pulmonary hypertension.   Today, she denies symptoms of palpitations, chest pain, shortness of breath, orthopnea, PND, lower extremity edema, dizziness, presyncope, syncope, or neurologic sequela. The patient is tolerating medications without difficulties and is otherwise without complaint today.   She works Education administrator houses and can work a full day with no issues 5 children and no CHF or problems with pregnancy.  Walks weekly and  no symptoms     Past Medical History  Diagnosis Date  . Atrial fibrillation ([place]) 05/27/2015  . Hypertension 05/27/2015    Past Surgical History  Procedure Laterality Date  . Cesarean section       Current Outpatient Prescriptions  Medication Sig Dispense Refill  . diltiazem (CARTIA XT) 240 MG 24 hr capsule Take 1 capsule (240 mg total) by mouth daily. 30 capsule 6  . ibuprofen (ADVIL,MOTRIN) 100 MG tablet Take 100 mg by mouth every 6 (six) hours as needed for fever.     No current facility-administered medications for this visit.    Allergies:   Review of patient's allergies indicates no known allergies.    Social History:  The patient  reports that she has never smoked. She does not have any smokeless tobacco history on file.   Family History:  The patient's family history is not on file.    ROS:  Please see the history of present illness.   Otherwise, review of systems are positive for none.   All other systems are reviewed and negative.    PHYSICAL EXAM: VS:  There were no vitals taken for this visit. , BMI There is no weight on file to calculate BMI. Affect appropriate Overweight hispanic female  HEENT: normal Neck supple with no adenopathy JVP normal no bruits no thyromegaly Lungs clear with no wheezing and good diaphragmatic motion Heart:  S1/S2 AS  Murmur S2 preserved ,  no rub, gallop or click PMI normal Abdomen: benighn, BS positve, no tenderness, no AAA no bruit.  No HSM or HJR Distal pulses intact with no bruits No edema Neuro non-focal Skin warm and dry No muscular weakness    EKG:  07/18/15  SR rate 78 short PR nonspecific ST changes    Recent Labs: 05/27/2015: BUN 11; Creatinine, Ser 0.52; Hemoglobin 14.5; Magnesium 2.2; Platelets 242; Potassium 3.3*; Sodium 138; TSH 2.172    Lipid Panel No results found for: CHOL, TRIG, HDL, CHOLHDL, VLDL, LDLCALC, LDLDIRECT    Wt Readings from Last 3 Encounters:  07/18/15 147 lb 9.6 oz (66.951 kg)    06/28/15 149 lb (67.586 kg)  05/31/15 151 lb 9.6 oz (68.765 kg)      Other studies Reviewed: Additional studies/ records that were reviewed today include: Afib clinic notes Echo and ECG;s .    ASSESSMENT AND PLAN:  1. AS:  S2 preserved asymptomatic moderate to severe AS by gradients.  Currently no indication for AVR/surgery so I don't think she needs TEE at this time. Discussed warning signs of syncope , dyspnea and chest pain. F/U with Dr Oval Linsey in 6 months with echo  2. PAF:  Post DCC maintaining NSR off anticoagulation    Current medicines are reviewed at length with the patient today.  The patient does not have concerns regarding medicines.  The following changes have been made:  no change  Labs/ tests ordered today include: Echo 6 months   No orders of the defined types were placed in this encounter.     Disposition:   FU with Dr Oval Linsey in 6 months      Signed, Jenkins Rouge, MD  07/25/2015 1:47 PM    [facility] [place], [place], [place]  [address] Phone: [phone]; Fax: [phone]

## 2015-10-24 ENCOUNTER — Other Ambulatory Visit (HOSPITAL_COMMUNITY): Payer: Self-pay | Admitting: *Deleted

## 2015-10-24 MED ORDER — DILTIAZEM HCL ER COATED BEADS 240 MG PO CP24: 240.0000 mg | ORAL_CAPSULE | Freq: Every day | ORAL | 1 refills | Status: DC

## 2016-01-20 ENCOUNTER — Other Ambulatory Visit (HOSPITAL_COMMUNITY): Payer: Self-pay | Admitting: Nurse Practitioner

## 2016-02-13 ENCOUNTER — Encounter: Payer: Self-pay | Admitting: Radiology

## 2016-02-24 ENCOUNTER — Other Ambulatory Visit (HOSPITAL_COMMUNITY): Payer: Self-pay | Admitting: Nurse Practitioner

## 2016-03-05 ENCOUNTER — Other Ambulatory Visit: Payer: Self-pay

## 2016-03-05 ENCOUNTER — Ambulatory Visit (HOSPITAL_COMMUNITY): Payer: BLUE CROSS/BLUE SHIELD | Attending: Cardiovascular Disease

## 2016-03-05 DIAGNOSIS — R9439 Abnormal result of other cardiovascular function study: Secondary | ICD-10-CM | POA: Insufficient documentation

## 2016-03-05 DIAGNOSIS — I350 Nonrheumatic aortic (valve) stenosis: Secondary | ICD-10-CM | POA: Diagnosis not present

## 2016-03-05 DIAGNOSIS — I501 Left ventricular failure: Secondary | ICD-10-CM | POA: Insufficient documentation

## 2016-03-05 LAB — ECHOCARDIOGRAM COMPLETE
AV Area VTI index: 0.45 cm2/m2
AV Area VTI: 0.73 cm2
AV Peak grad: 82 mmHg
AV VEL mean LVOT/AV: 0.25
AV area mean vel ind: 0.44 cm2/m2
AV peak Index: 0.45
AVAREAMEANV: 0.72 cm2
AVG: 46 mmHg
AVPKVEL: 453 cm/s
Ao pk vel: 0.26 m/s
Ao-asc: 31 cm
CHL CUP AV VALUE AREA INDEX: 0.45
CHL CUP AV VEL: 0.73
CHL CUP DOP CALC LVOT VTI: 25.9 cm
CHL CUP MV DEC (S): 275
CHL CUP TV REG PEAK VELOCITY: 314 cm/s
DOP CAL AO MEAN VELOCITY: 313 cm/s
EERAT: 11.42
EWDT: 275 ms
FS: 34 % (ref 28–44)
IVS/LV PW RATIO, ED: 1.01
LA vol index: 44.4 mL/m2
LADIAMINDEX: 2.53 cm/m2
LASIZE: 41 mm
LAVOL: 72 mL
LAVOLA4C: 61 mL
LDCA: 2.84 cm2
LEFT ATRIUM END SYS DIAM: 41 mm
LV E/e' medial: 11.42
LV E/e'average: 11.42
LV PW d: 12.1 mm — AB (ref 0.6–1.1)
LV SIMPSON'S DISK: 64
LV dias vol index: 55 mL/m2
LV dias vol: 89 mL (ref 46–106)
LV sys vol index: 20 mL/m2
LV sys vol: 32 mL (ref 14–42)
LVELAT: 9.46 cm/s
LVOT SV: 74 mL
LVOT peak grad rest: 5 mmHg
LVOTD: 19 mm
LVOTPV: 117 cm/s
LVOTVTI: 0.26 cm
MV Peak grad: 5 mmHg
MVPKAVEL: 118 m/s
MVPKEVEL: 108 m/s
P 1/2 time: 463 ms
S' Lateral: 14 cm/s
Stroke v: 57 ml
TDI e' lateral: 9.46
TDI e' medial: 7.02
TRMAXVEL: 314 cm/s
VTI: 101 cm
Valve area: 0.73 cm2

## 2016-03-12 ENCOUNTER — Ambulatory Visit (INDEPENDENT_AMBULATORY_CARE_PROVIDER_SITE_OTHER): Payer: BLUE CROSS/BLUE SHIELD | Admitting: Cardiovascular Disease

## 2016-03-12 ENCOUNTER — Encounter: Payer: Self-pay | Admitting: Cardiovascular Disease

## 2016-03-12 VITALS — BP 153/90 | HR 78 | Ht 59.0 in | Wt 153.6 lb

## 2016-03-12 DIAGNOSIS — I350 Nonrheumatic aortic (valve) stenosis: Secondary | ICD-10-CM

## 2016-03-12 DIAGNOSIS — I480 Paroxysmal atrial fibrillation: Secondary | ICD-10-CM | POA: Diagnosis not present

## 2016-03-12 HISTORY — DX: Nonrheumatic aortic (valve) stenosis: I35.0

## 2016-03-12 MED ORDER — DILTIAZEM HCL ER COATED BEADS 240 MG PO CP24: 240.0000 mg | ORAL_CAPSULE | Freq: Every day | ORAL | 1 refills | Status: DC

## 2016-03-12 NOTE — Progress Notes (Signed)
Cardiology Office Note   Date:  03/12/2016   ID:  Whitney Juarez, DOB 1965-08-02, MRN [id number]  PCP:  No PCP Per Patient  Cardiologist:   Skeet Latch, MD   Chief Complaint  Patient presents with  . Follow-up    patient reports having occasional chest pain. denies having SOB      History of Present Illness: Whitney Juarez is a 51 y.o. female with hypertension and paroxysmal atrial fibrillation who presents for follow-up. She presented to the emergency department 05/2015 with new onset of atrial fibrillation with rapid ventricular response. She was started on a diltiazem drip and underwent cardioversion in the emergency department. She was started on diltiazem and Eliquis at that time.  She was seen in the atrial fibrillation clinic 05/31/15 and instructed to stop Eliquis after 1 month due to [facility] of 1.  She had an outpatient echocardiogram 07/11/15 that revealed LVEF 60-65% with grade 2 diastolic dysfunction. She also had mild aortic regurgitation and moderate to severe aortic stenosis (mean gradient 37 mmHg, peak velocity 3.7 m/s).  She saw Dr. Johnsie Cancel 07/2015 and was doing well.  She had a follow-up echocardiogram 03/05/16 that revealed LVEF 60-65% with severe aortic stenosis and moderate aortic regurgitation.  However visually her valve did not appear to be severely stenotic and transesophageal echocardiogram was recommended.  Whitney Juarez has been doing well.  Her mother died last month.  She recalls jerking her arms upward in surprise when she heard the news.  Since then she has experienced episodes of mild, left sided chest pain.  There is no associated shortness of breath or nausea.  It typically occurs with movement of her arms.  She denies exertional chest pain.  She notes that she forgot to take her blood pressure medication this AM.  She denies palpitations, lightheadedness, orthopnea or PND.    Past Medical History:  Diagnosis Date  . Atrial fibrillation ([place]) 05/27/2015  . Hypertension  05/27/2015  . Severe aortic stenosis 03/12/2016    Past Surgical History:  Procedure Laterality Date  . CESAREAN SECTION       Current Outpatient Prescriptions  Medication Sig Dispense Refill  . diltiazem (CARTIA XT) 240 MG 24 hr capsule Take 1 capsule (240 mg total) by mouth daily. 90 capsule 1   No current facility-administered medications for this visit.     Allergies:   Patient has no known allergies.    Social History:  The patient  reports that she has never smoked. She does not have any smokeless tobacco history on file.   Family History:  The patient's family history includes Heart disease in her brother.    ROS:  Please see the history of present illness.   Otherwise, review of systems are positive for none.   All other systems are reviewed and negative.    PHYSICAL EXAM: VS:  BP (!) 153/90   Pulse 78   Ht 4\' 11"  (1.499 m)   Wt 69.7 kg (153 lb 9.6 oz)   BMI 31.02 kg/m  , BMI Body mass index is 31.02 kg/m. GENERAL:  Well appearing HEENT:  Pupils equal round and reactive, fundi not visualized, oral mucosa unremarkable NECK:  No jugular venous distention, waveform within normal limits, carotid upstroke brisk and symmetric LYMPHATICS:  No cervical adenopathy LUNGS:  Clear to auscultation bilaterally HEART:  RRR.  PMI not displaced or sustained,S1 and S2 within normal limits, no S3, no S4, no clicks, no rubs, III/VI late-peaking systolic murmur at the left  and right upper sternal borders. +Radiation to bilateral carotids.  ABD:  Flat, positive bowel sounds normal in frequency in pitch, no bruits, no rebound, no guarding, no midline pulsatile mass, no hepatomegaly, no splenomegaly EXT:  2 plus pulses throughout, no edema, no cyanosis no clubbing SKIN:  No rashes no nodules NEURO:  Cranial nerves II through XII grossly intact, motor grossly intact throughout PSYCH:  Cognitively intact, oriented to person place and time   EKG:  EKG is ordered today. The ekg ordered  today demonstrates Sinus rhythm. Rate 78 bpm.   Recent Labs: 05/27/2015: BUN 11; Creatinine, Ser 0.52; Hemoglobin 14.5; Magnesium 2.2; Platelets 242; Potassium 3.3; Sodium 138; TSH 2.172    Lipid Panel No results found for: CHOL, TRIG, HDL, CHOLHDL, VLDL, LDLCALC, LDLDIRECT    Wt Readings from Last 3 Encounters:  03/12/16 69.7 kg (153 lb 9.6 oz)  07/25/15 67.2 kg (148 lb 1.9 oz)  07/18/15 67 kg (147 lb 9.6 oz)      ASSESSMENT AND PLAN:  # Elevated aortic valve gradients: Whitney Juarez' gradients across her aortic valve are severely elevated. However, upon review of her echocardiogram it does not appear that her aortic valve is severely stenosed. I suspect that she has either subvalvular or supravalvular aortic stenosis. We will obtain an transesophageal echocardiogram to better evaluate. She may need left heart catheterization if this is unrevealing. She is currently asymptomatic. Plan for TEE on 03/27/16.   # Hypertension: Blood pressure is poorly-controlled. However, she has not yet taken her diltiazem today. Therefore we will not make any changes at this time.  # Paroxysmal atrial fibrillation: Whitney Juarez has not experienced any recurrent episodes of atrial fibrillation. Her anticoagulation was stopped after 30 days given that her only risk factor was female sex.    Current medicines are reviewed at length with the patient today.  The patient does not have concerns regarding medicines.  The following changes have been made:  no change  Labs/ tests ordered today include:   Orders Placed This Encounter  Procedures  . EKG 12-Lead  . ECHO TEE     Disposition:   FU with Kaitlen Redford C. Oval Linsey, MD, [facility] in 2 months.     This note was written with the assistance of speech recognition software.  Please excuse any transcriptional errors.  Signed, Avyn Coate C. Oval Linsey, MD, [facility]  03/12/2016 10:25 AM    [place]

## 2016-03-12 NOTE — Patient Instructions (Addendum)
Your physician recommends that you continue on your current medications as directed. Please refer to the Current Medication list given to you today.   Your physician has requested that you have a TEE. During a TEE, sound waves are used to create images of your heart. It provides your doctor with information about the size and shape of your heart and how well your heart's chambers and valves are working. In this test, a transducer is attached to the end of a flexible tube that's guided down your throat and into your esophagus (the tube leading from you mouth to your stomach) to get a more detailed image of your heart. You are not awake for the procedure. Please see the instruction sheet given to you today. For further information please visit [email or web address]. WITH DR [facility]   03-27-16    Your physician recommends that you schedule a follow-up appointment in:  2 [place]

## 2016-03-20 ENCOUNTER — Telehealth: Payer: Self-pay | Admitting: Cardiovascular Disease

## 2016-03-20 DIAGNOSIS — I480 Paroxysmal atrial fibrillation: Secondary | ICD-10-CM

## 2016-03-20 NOTE — Telephone Encounter (Signed)
New message    Whitney Juarez is calling from [facility]. Pt is there to have labs drown but there is no order.

## 2016-03-21 ENCOUNTER — Telehealth: Payer: Self-pay | Admitting: Cardiovascular Disease

## 2016-03-21 DIAGNOSIS — I480 Paroxysmal atrial fibrillation: Secondary | ICD-10-CM | POA: Diagnosis not present

## 2016-03-21 NOTE — Telephone Encounter (Signed)
Whitney Juarez contacted [facility] and has sent/faxed orders.

## 2016-03-21 NOTE — Telephone Encounter (Signed)
Placed orders for cbc/bmet/pt/inr Faxed to [facility] secondary to them not being able to view

## 2016-03-21 NOTE — Telephone Encounter (Signed)
New message     Pt at [facility] lab  And lab orders are not in computer

## 2016-03-27 ENCOUNTER — Ambulatory Visit (HOSPITAL_BASED_OUTPATIENT_CLINIC_OR_DEPARTMENT_OTHER)
Admission: RE | Admit: 2016-03-27 | Discharge: 2016-03-27 | Disposition: A | Discharge status: Home / Self Care | Payer: BLUE CROSS/BLUE SHIELD | Source: Ambulatory Visit | Attending: Cardiovascular Disease | Admitting: Cardiovascular Disease

## 2016-03-27 ENCOUNTER — Encounter (HOSPITAL_COMMUNITY): Payer: Self-pay | Admitting: Cardiovascular Disease

## 2016-03-27 ENCOUNTER — Ambulatory Visit (HOSPITAL_COMMUNITY)
Admission: RE | Admit: 2016-03-27 | Discharge: 2016-03-27 | Disposition: A | Discharge status: Home / Self Care | Payer: BLUE CROSS/BLUE SHIELD | Source: Ambulatory Visit | Attending: Cardiovascular Disease | Admitting: Cardiovascular Disease

## 2016-03-27 ENCOUNTER — Encounter (HOSPITAL_COMMUNITY)
Admission: RE | Disposition: A | Discharge status: Home / Self Care | Payer: Self-pay | Source: Ambulatory Visit | Attending: Cardiovascular Disease

## 2016-03-27 DIAGNOSIS — I480 Paroxysmal atrial fibrillation: Secondary | ICD-10-CM | POA: Diagnosis not present

## 2016-03-27 DIAGNOSIS — I10 Essential (primary) hypertension: Secondary | ICD-10-CM | POA: Diagnosis not present

## 2016-03-27 DIAGNOSIS — R0789 Other chest pain: Secondary | ICD-10-CM | POA: Insufficient documentation

## 2016-03-27 DIAGNOSIS — I350 Nonrheumatic aortic (valve) stenosis: Secondary | ICD-10-CM | POA: Diagnosis not present

## 2016-03-27 HISTORY — PX: TEE WITHOUT CARDIOVERSION: SHX5443

## 2016-03-27 LAB — ECHO TEE
AV Mean grad: 38 mmHg
AV Peak grad: 66 mmHg
AV pk vel: 407 cm/s
DOP CAL AO MEAN VELOCITY: 282 cm/s
LDCA: 2.27 cm2
LVOTD: 17 mm
Reg peak vel: 268 cm/s
TRMAXVEL: 268 cm/s
VTI: 90.9 cm

## 2016-03-27 SURGERY — ECHOCARDIOGRAM, TRANSESOPHAGEAL
Anesthesia: Moderate Sedation

## 2016-03-27 MED ORDER — FENTANYL CITRATE (PF) 100 MCG/2ML IJ SOLN
INTRAMUSCULAR | Status: DC | PRN
  Administered 2016-03-27 (×4): 25 ug via INTRAVENOUS

## 2016-03-27 MED ORDER — FENTANYL CITRATE (PF) 100 MCG/2ML IJ SOLN
INTRAMUSCULAR | Status: AC
  Filled 2016-03-27: qty 2

## 2016-03-27 MED ORDER — MIDAZOLAM HCL 5 MG/ML IJ SOLN
INTRAMUSCULAR | Status: AC
  Filled 2016-03-27: qty 2

## 2016-03-27 MED ORDER — SODIUM CHLORIDE 0.9 % IV SOLN
INTRAVENOUS | Status: DC
  Administered 2016-03-27: 08:00:00 via INTRAVENOUS

## 2016-03-27 MED ORDER — MIDAZOLAM HCL 10 MG/2ML IJ SOLN
INTRAMUSCULAR | Status: DC | PRN
  Administered 2016-03-27 (×2): 2 mg via INTRAVENOUS
  Administered 2016-03-27: 1 mg via INTRAVENOUS

## 2016-03-27 MED ORDER — BUTAMBEN-TETRACAINE-BENZOCAINE 2-2-14 % EX AERO
INHALATION_SPRAY | CUTANEOUS | Status: DC | PRN
  Administered 2016-03-27: 2 via TOPICAL

## 2016-03-27 NOTE — H&P (View-Only) (Signed)
Cardiology Office Note   Date:  03/12/2016   ID:  Whitney Juarez, DOB 10/02/1965, MRN [id number]  PCP:  No PCP Per Patient  Cardiologist:   Skeet Latch, MD   Chief Complaint  Patient presents with  . Follow-up    patient reports having occasional chest pain. denies having SOB      History of Present Illness: Whitney Juarez is a 51 y.o. female with hypertension and paroxysmal atrial fibrillation who presents for follow-up. She presented to the emergency department 05/2015 with new onset of atrial fibrillation with rapid ventricular response. She was started on a diltiazem drip and underwent cardioversion in the emergency department. She was started on diltiazem and Eliquis at that time.  She was seen in the atrial fibrillation clinic 05/31/15 and instructed to stop Eliquis after 1 month due to [facility] of 1.  She had an outpatient echocardiogram 07/11/15 that revealed LVEF 60-65% with grade 2 diastolic dysfunction. She also had mild aortic regurgitation and moderate to severe aortic stenosis (mean gradient 37 mmHg, peak velocity 3.7 m/s).  She saw Dr. Johnsie Cancel 07/2015 and was doing well.  She had a follow-up echocardiogram 03/05/16 that revealed LVEF 60-65% with severe aortic stenosis and moderate aortic regurgitation.  However visually her valve did not appear to be severely stenotic and transesophageal echocardiogram was recommended.  Whitney Juarez has been doing well.  Her mother died last month.  She recalls jerking her arms upward in surprise when she heard the news.  Since then she has experienced episodes of mild, left sided chest pain.  There is no associated shortness of breath or nausea.  It typically occurs with movement of her arms.  She denies exertional chest pain.  She notes that she forgot to take her blood pressure medication this AM.  She denies palpitations, lightheadedness, orthopnea or PND.    Past Medical History:  Diagnosis Date  . Atrial fibrillation ([place]) 05/27/2015  . Hypertension  05/27/2015  . Severe aortic stenosis 03/12/2016    Past Surgical History:  Procedure Laterality Date  . CESAREAN SECTION       Current Outpatient Prescriptions  Medication Sig Dispense Refill  . diltiazem (CARTIA XT) 240 MG 24 hr capsule Take 1 capsule (240 mg total) by mouth daily. 90 capsule 1   No current facility-administered medications for this visit.     Allergies:   Patient has no known allergies.    Social History:  The patient  reports that she has never smoked. She does not have any smokeless tobacco history on file.   Family History:  The patient's family history includes Heart disease in her brother.    ROS:  Please see the history of present illness.   Otherwise, review of systems are positive for none.   All other systems are reviewed and negative.    PHYSICAL EXAM: VS:  BP (!) 153/90   Pulse 78   Ht 4\' 11"  (1.499 m)   Wt 69.7 kg (153 lb 9.6 oz)   BMI 31.02 kg/m  , BMI Body mass index is 31.02 kg/m. GENERAL:  Well appearing HEENT:  Pupils equal round and reactive, fundi not visualized, oral mucosa unremarkable NECK:  No jugular venous distention, waveform within normal limits, carotid upstroke brisk and symmetric LYMPHATICS:  No cervical adenopathy LUNGS:  Clear to auscultation bilaterally HEART:  RRR.  PMI not displaced or sustained,S1 and S2 within normal limits, no S3, no S4, no clicks, no rubs, III/VI late-peaking systolic murmur at the left  and right upper sternal borders. +Radiation to bilateral carotids.  ABD:  Flat, positive bowel sounds normal in frequency in pitch, no bruits, no rebound, no guarding, no midline pulsatile mass, no hepatomegaly, no splenomegaly EXT:  2 plus pulses throughout, no edema, no cyanosis no clubbing SKIN:  No rashes no nodules NEURO:  Cranial nerves II through XII grossly intact, motor grossly intact throughout PSYCH:  Cognitively intact, oriented to person place and time   EKG:  EKG is ordered today. The ekg ordered  today demonstrates Sinus rhythm. Rate 78 bpm.   Recent Labs: 05/27/2015: BUN 11; Creatinine, Ser 0.52; Hemoglobin 14.5; Magnesium 2.2; Platelets 242; Potassium 3.3; Sodium 138; TSH 2.172    Lipid Panel No results found for: CHOL, TRIG, HDL, CHOLHDL, VLDL, LDLCALC, LDLDIRECT    Wt Readings from Last 3 Encounters:  03/12/16 69.7 kg (153 lb 9.6 oz)  07/25/15 67.2 kg (148 lb 1.9 oz)  07/18/15 67 kg (147 lb 9.6 oz)      ASSESSMENT AND PLAN:  # Elevated aortic valve gradients: Whitney Juarez' gradients across her aortic valve are severely elevated. However, upon review of her echocardiogram it does not appear that her aortic valve is severely stenosed. I suspect that she has either subvalvular or supravalvular aortic stenosis. We will obtain an transesophageal echocardiogram to better evaluate. She may need left heart catheterization if this is unrevealing. She is currently asymptomatic. Plan for TEE on 03/27/16.   # Hypertension: Blood pressure is poorly-controlled. However, she has not yet taken her diltiazem today. Therefore we will not make any changes at this time.  # Paroxysmal atrial fibrillation: Whitney Juarez has not experienced any recurrent episodes of atrial fibrillation. Her anticoagulation was stopped after 30 days given that her only risk factor was female sex.    Current medicines are reviewed at length with the patient today.  The patient does not have concerns regarding medicines.  The following changes have been made:  no change  Labs/ tests ordered today include:   Orders Placed This Encounter  Procedures  . EKG 12-Lead  . ECHO TEE     Disposition:   FU with Imri Lor C. Oval Linsey, MD, [facility] in 2 months.     This note was written with the assistance of speech recognition software.  Please excuse any transcriptional errors.  Signed, Alphonzo Devera C. Oval Linsey, MD, [facility]  03/12/2016 10:25 AM    [place]

## 2016-03-27 NOTE — Interval H&P Note (Signed)
History and Physical Interval Note:  03/27/2016 7:55 AM  Whitney Juarez  has presented today for surgery, with the diagnosis of aortic stenosis.  The various methods of treatment have been discussed with the patient and family. After consideration of risks, benefits and other options for treatment, the patient has consented to  Procedure(s): TRANSESOPHAGEAL ECHOCARDIOGRAM (TEE) (N/A) as a surgical intervention .  The patient's history has been reviewed, patient examined, no change in status, stable for surgery.  I have reviewed the patient's chart and labs.  Questions were answered to the patient's satisfaction.     Skeet Latch, MD 03/27/2016 7:55 AM

## 2016-03-27 NOTE — CV Procedure (Signed)
Brief TEE Note  LVEF 60-65% Severely elevated aortic valve gradients consistent with aortic stenosis.  However, aortic valve leaflets appear to move normally.  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  Turbulent flow centers mostly on the left and non-coronary cusps. 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  The patient's heart rate, blood pressure, and oxygen saturation are monitored continuously during the procedure. The period of conscious sedation is 31 minutes, of which I was present face-to-face 100% of this time.  For additional details see full report.   Joanmarie Tsang C. Oval Linsey, MD, [facility]  03/27/2016 9:40 AM

## 2016-03-27 NOTE — Discharge Instructions (Signed)

## 2016-04-01 LAB — CULTURE, BLOOD (ROUTINE X 2)
CULTURE: NO GROWTH
Culture: NO GROWTH

## 2016-04-05 ENCOUNTER — Telehealth: Payer: Self-pay | Admitting: Cardiovascular Disease

## 2016-04-05 DIAGNOSIS — I350 Nonrheumatic aortic (valve) stenosis: Secondary | ICD-10-CM

## 2016-04-05 NOTE — Telephone Encounter (Signed)
Will forward to Dr Cochise for review  

## 2016-04-05 NOTE — Telephone Encounter (Signed)
New message      Son states that someone called pt to give her the TEE results.  She did not understand the nurse.  Son is calling to get TEE results to give to patient

## 2016-04-07 ENCOUNTER — Other Ambulatory Visit (HOSPITAL_COMMUNITY): Payer: Self-pay | Admitting: Nurse Practitioner

## 2016-04-12 ENCOUNTER — Other Ambulatory Visit: Payer: Self-pay | Admitting: *Deleted

## 2016-04-12 NOTE — Telephone Encounter (Signed)
Returned call to son (ok per DPR)-aware of Echo TEE results and recommendations:  Notes Recorded by Skeet Latch, MD on 04/09/2016 at 8:48 AM EST Please order a cardiac CT-A with the TAVR protocol for Dr. Meda Coffee to read. This is to evaluate her aortic valve and ascending aorta.   Order placed and son aware someone will be contacting them to schedule.

## 2016-04-20 ENCOUNTER — Encounter: Payer: Self-pay | Admitting: Cardiovascular Disease

## 2016-04-20 ENCOUNTER — Ambulatory Visit (INDEPENDENT_AMBULATORY_CARE_PROVIDER_SITE_OTHER): Payer: BLUE CROSS/BLUE SHIELD | Admitting: Cardiovascular Disease

## 2016-04-20 VITALS — BP 164/84 | HR 75 | Ht 59.0 in | Wt 154.6 lb

## 2016-04-20 DIAGNOSIS — I351 Nonrheumatic aortic (valve) insufficiency: Secondary | ICD-10-CM

## 2016-04-20 DIAGNOSIS — I350 Nonrheumatic aortic (valve) stenosis: Secondary | ICD-10-CM | POA: Diagnosis not present

## 2016-04-20 DIAGNOSIS — I480 Paroxysmal atrial fibrillation: Secondary | ICD-10-CM | POA: Diagnosis not present

## 2016-04-20 DIAGNOSIS — I10 Essential (primary) hypertension: Secondary | ICD-10-CM

## 2016-04-20 HISTORY — DX: Nonrheumatic aortic (valve) insufficiency: I35.1

## 2016-04-20 NOTE — Progress Notes (Signed)
Cardiology Office Note   Date:  04/20/2016   ID:  Whitney Juarez, DOB 04/13/1965, MRN [id number]  PCP:  No PCP Per Patient  Cardiologist:   Skeet Latch, MD   Chief Complaint  Patient presents with  . Follow-up    Pt states no Sx.       History of Present Illness: Whitney Juarez is a 51 y.o. female with severely elevated aortic valve gradients and mild-moderate aortic regurgitation, hypertension and paroxysmal atrial fibrillation who presents for follow-up. She presented to the emergency department 05/2015 with new onset of atrial fibrillation with rapid ventricular response. She was started on a diltiazem drip and underwent cardioversion in the emergency department. She was started on diltiazem and Eliquis at that time.  She was seen in the atrial fibrillation clinic 05/31/15 and instructed to stop Eliquis after 1 month due to [facility] of 1.  She had an outpatient echocardiogram 07/11/15 that revealed LVEF 60-65% with grade 2 diastolic dysfunction. She also had mild aortic regurgitation and moderate to severe aortic stenosis (mean gradient 37 mmHg, peak velocity 3.7 m/s).  She saw Dr. Johnsie Cancel 07/2015 and was doing well.  She had a follow-up echocardiogram 03/05/16 that revealed LVEF 60-65% with severe aortic stenosis and moderate aortic regurgitation.  However visually her valve did not appear to be severely stenotic and transesophageal echocardiogram was recommended.  She underwent TEE 03/27/16 that revealed a normal aortic valve but severely elevated aortic valve gradients and mild to moderate aortic regurgitation.  Since her last appointment Whitney Juarez has been feeling well. She no longer has chest pain or shortness of breath. She denies lower extremity edema, orthopnea, or PND. She does not check her blood pressure at home but thinks it is elevated today because she is nervous.  Past Medical History:  Diagnosis Date  . Aortic regurgitation 04/20/2016  . Atrial fibrillation  ([place]) 05/27/2015  . Hypertension 05/27/2015  . Severe aortic stenosis 03/12/2016    Past Surgical History:  Procedure Laterality Date  . CESAREAN SECTION    . TEE WITHOUT CARDIOVERSION N/A 03/27/2016   Procedure: TRANSESOPHAGEAL ECHOCARDIOGRAM (TEE);  Surgeon: Skeet Latch, MD;  Location: [facility] ENDOSCOPY;  Service: Cardiovascular;  Laterality: N/A;     Current Outpatient Prescriptions  Medication Sig Dispense Refill  . diltiazem (CARDIZEM CD) 240 MG 24 hr capsule TAKE 1 CAPSULE (240 MG TOTAL) BY MOUTH DAILY. 30 capsule 11   No current facility-administered medications for this visit.     Allergies:   Patient has no known allergies.    Social History:  The patient  reports that she has never smoked. She has never used smokeless tobacco. She reports that she does not drink alcohol or use drugs.   Family History:  The patient's family history includes Heart disease in her brother.    ROS:  Please see the history of present illness.   Otherwise, review of systems are positive for none.   All other systems are reviewed and negative.    PHYSICAL EXAM: VS:  BP (!) 164/84   Pulse 75   Ht 4\' 11"  (1.499 m)   Wt 70.1 kg (154 lb 9.6 oz)   LMP  (LMP Unknown)   BMI 31.23 kg/m  , BMI Body mass index is 31.23 kg/m. GENERAL:  Well appearing HEENT:  Pupils equal round and reactive, fundi not visualized, oral mucosa unremarkable NECK:  No jugular venous distention, waveform within normal limits, carotid upstroke brisk and symmetric LYMPHATICS:  No cervical adenopathy  LUNGS:  Clear to auscultation bilaterally HEART:  RRR.  PMI not displaced or sustained,S1 and S2 within normal limits, no S3, no S4, no clicks, no rubs, III/VI late-peaking systolic murmur at the left and right upper sternal borders. +Radiation to bilateral carotids.  ABD:  Flat, positive bowel sounds normal in frequency in pitch, no bruits, no rebound, no guarding, no midline pulsatile mass, no hepatomegaly, no splenomegaly EXT:   2 plus pulses throughout, no edema, no cyanosis no clubbing SKIN:  No rashes no nodules NEURO:  Cranial nerves II through XII grossly intact, motor grossly intact throughout PSYCH:  Cognitively intact, oriented to person place and time   head on the 13th be ready for herEKG:  EKG is not ordered today. The ekg ordered today demonstrates Sinus rhythm. Rate 78 bpm.   Recent Labs: 05/27/2015: BUN 11; Creatinine, Ser 0.52; Hemoglobin 14.5; Magnesium 2.2; Platelets 242; Potassium 3.3; Sodium 138; TSH 2.172    Lipid Panel No results found for: CHOL, TRIG, HDL, CHOLHDL, VLDL, LDLCALC, LDLDIRECT    Wt Readings from Last 3 Encounters:  04/20/16 70.1 kg (154 lb 9.6 oz)  03/12/16 69.7 kg (153 lb 9.6 oz)  07/25/15 67.2 kg (148 lb 1.9 oz)      ASSESSMENT AND PLAN:  # Elevated aortic valve gradients: Whitney Juarez' gradients across her aortic valve are severely elevated.  On TEE her aortic valve appears to be structurally normal. We will obtain a cardiac CT-A to evaluate her aortic valve and aortic root.   # Hypertension: Blood pressure is poorly-controlled.  she thinks that it has been well-controlled at home. I asked her to keep a log of her blood pressures and bring it to her next appointment. Continue diltiazem for now.   # Paroxysmal atrial fibrillation: Whitney Juarez has not experienced any recurrent episodes of atrial fibrillation. Her anticoagulation was stopped after 30 days given that her only risk factor was female sex.    Current medicines are reviewed at length with the patient today.  The patient does not have concerns regarding medicines.  The following changes have been made:  no change  Labs/ tests ordered today include:   No orders of the defined types were placed in this encounter.    Disposition:   FU with Regana Kemple C. Oval Linsey, MD, [facility] in 1 month.     This note was written with the assistance of speech recognition software.  Please excuse any transcriptional  errors.  Signed, Raliyah Montella C. Oval Linsey, MD, [facility]  04/20/2016 3:12 PM    [place]

## 2016-04-20 NOTE — Patient Instructions (Signed)
Medication Instructions:  Your physician recommends that you continue on your current medications as directed. Please refer to the Current Medication list given to you today.  Labwork: none  Testing/Procedures: SCHEDULING IS IN THE PROCESS OF GETTING YOU CARDIAC CT SCHEDULED, THEY WILL CALL YOU WITH AN APPOINTMENT DATE AND TIME   Follow-Up: Your physician recommends that you schedule a follow-up appointment in: [place] BLOOD PRESSURES AND BRING TO YOUR FOLLOW UP APPOINTMENT   If you need a refill on your cardiac medications before your next appointment, please call your pharmacy.

## 2016-05-01 ENCOUNTER — Other Ambulatory Visit: Payer: Self-pay | Admitting: *Deleted

## 2016-05-01 MED ORDER — DILTIAZEM HCL ER COATED BEADS 240 MG PO CP24: 240.0000 mg | ORAL_CAPSULE | Freq: Every day | ORAL | 0 refills | Status: DC

## 2016-05-01 NOTE — Telephone Encounter (Signed)
Refill for 90 day sent to [facility] as requested

## 2016-05-09 ENCOUNTER — Ambulatory Visit (HOSPITAL_COMMUNITY)
Admission: RE | Admit: 2016-05-09 | Discharge: 2016-05-09 | Disposition: A | Discharge status: Home / Self Care | Payer: BLUE CROSS/BLUE SHIELD | Source: Ambulatory Visit | Attending: Cardiovascular Disease | Admitting: Cardiovascular Disease

## 2016-05-09 ENCOUNTER — Encounter (HOSPITAL_COMMUNITY): Payer: Self-pay

## 2016-05-09 DIAGNOSIS — R918 Other nonspecific abnormal finding of lung field: Secondary | ICD-10-CM | POA: Diagnosis not present

## 2016-05-09 DIAGNOSIS — I700 Atherosclerosis of aorta: Secondary | ICD-10-CM | POA: Insufficient documentation

## 2016-05-09 DIAGNOSIS — I350 Nonrheumatic aortic (valve) stenosis: Secondary | ICD-10-CM

## 2016-05-09 MED ORDER — IOPAMIDOL (ISOVUE-370) INJECTION 76%
INTRAVENOUS | Status: AC
  Administered 2016-05-09: 80 mL
  Filled 2016-05-09: qty 100

## 2016-05-09 MED ORDER — NITROGLYCERIN 0.4 MG SL SUBL
0.8000 mg | SUBLINGUAL_TABLET | Freq: Once | SUBLINGUAL | Status: AC
  Administered 2016-05-09: 0.8 mg via SUBLINGUAL

## 2016-05-09 MED ORDER — METOPROLOL TARTRATE 5 MG/5ML IV SOLN
5.0000 mg | INTRAVENOUS | Status: DC | PRN
  Administered 2016-05-09 (×2): 5 mg via INTRAVENOUS

## 2016-05-09 MED ORDER — NITROGLYCERIN 0.4 MG SL SUBL
SUBLINGUAL_TABLET | SUBLINGUAL | Status: AC
  Filled 2016-05-09: qty 2

## 2016-05-09 MED ORDER — METOPROLOL TARTRATE 5 MG/5ML IV SOLN
INTRAVENOUS | Status: AC
  Filled 2016-05-09: qty 5

## 2016-05-09 NOTE — Progress Notes (Signed)
CT scan completed. Tolerated well. D/C home with son. Awake and alert. In no distress. 

## 2016-05-25 ENCOUNTER — Ambulatory Visit (INDEPENDENT_AMBULATORY_CARE_PROVIDER_SITE_OTHER): Payer: BLUE CROSS/BLUE SHIELD | Admitting: Cardiovascular Disease

## 2016-05-25 ENCOUNTER — Encounter: Payer: Self-pay | Admitting: Cardiovascular Disease

## 2016-05-25 VITALS — BP 132/86 | HR 72 | Ht 59.0 in | Wt 156.0 lb

## 2016-05-25 DIAGNOSIS — Q244 Congenital subaortic stenosis: Secondary | ICD-10-CM | POA: Diagnosis not present

## 2016-05-25 DIAGNOSIS — I10 Essential (primary) hypertension: Secondary | ICD-10-CM

## 2016-05-25 DIAGNOSIS — I480 Paroxysmal atrial fibrillation: Secondary | ICD-10-CM

## 2016-05-25 NOTE — Patient Instructions (Signed)
Medication Instructions:  Your physician recommends that you continue on your current medications as directed. Please refer to the Current Medication list given to you today.  Labwork: none  Testing/Procedures: none  Follow-Up: Your physician wants you to follow-up in: 1 year ov You will receive a reminder letter in the mail two months in advance. If you don't receive a letter, please call our office to schedule the follow-up appointment.  Any Other Special Instructions Will Be Listed Below (If Applicable). Monitor you blood pressure at home and if it does not remain below 130/80 call the office at [phone]  If you need a refill on your cardiac medications before your next appointment, please call your pharmacy.

## 2016-05-25 NOTE — Progress Notes (Signed)
Cardiology Office Note   Date:  05/26/2016   ID:  Whitney Juarez, DOB 19-Sep-1965, MRN [id number]  PCP:  No PCP Per Patient  Cardiologist:   Skeet Latch, MD   Chief Complaint  Patient presents with  . Follow-up    no chest pain, shortness of breath,edema, pain or cramping in legs, lightheaded or dizzess      History of Present Illness: Whitney Juarez is a 51 y.o. female with severely elevated aortic valve gradients and mild-moderate aortic regurgitation, hypertension and paroxysmal atrial fibrillation who presents for follow-up. She presented to the emergency department 05/2015 with new onset of atrial fibrillation with rapid ventricular response. She was started on a diltiazem drip and underwent cardioversion in the emergency department. She was started on diltiazem and Eliquis at that time.  She was seen in the atrial fibrillation clinic 05/31/15 and instructed to stop Eliquis after 1 month due to [facility] of 1.  She had an outpatient echocardiogram 07/11/15 that revealed LVEF 60-65% with grade 2 diastolic dysfunction. She also had mild aortic regurgitation and moderate to severe aortic stenosis (mean gradient 37 mmHg, peak velocity 3.7 m/s).  She saw Dr. Johnsie Cancel 07/2015 and was doing well.  She had a follow-up echocardiogram 03/05/16 that revealed LVEF 60-65% with severe aortic stenosis and moderate aortic regurgitation.  However visually her valve did not appear to be severely stenotic and transesophageal echocardiogram was recommended.  She underwent TEE 03/27/16 that revealed a normal aortic valve but severely elevated aortic valve gradients and mild to moderate aortic regurgitation.  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  She had a normal aortic valve no CAD.  Whitney Juarez has ben doing well.  She denies chest pain, shortness of breath, orthopnea or PND.  She has not experienced any palpitations, lightheadedness or  dizziness.  Past Medical History:  Diagnosis Date  . Aortic regurgitation 04/20/2016  . Atrial fibrillation ([place]) 05/27/2015  . Hypertension 05/27/2015  . Severe aortic stenosis 03/12/2016  . 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 CESAREAN SECTION    . TEE WITHOUT CARDIOVERSION N/A 03/27/2016   Procedure: TRANSESOPHAGEAL ECHOCARDIOGRAM (TEE);  Surgeon: Skeet Latch, MD;  Location: [facility] ENDOSCOPY;  Service: Cardiovascular;  Laterality: N/A;     Current Outpatient Prescriptions  Medication Sig Dispense Refill  . diltiazem (CARDIZEM CD) 240 MG 24 hr capsule Take 1 capsule (240 mg total) by mouth daily. 90 capsule 0   No current facility-administered medications for this visit.     Allergies:   Patient has no known allergies.    Social History:  The patient  reports that she has never smoked. She has never used smokeless tobacco. She reports that she does not drink alcohol or use drugs.   Family History:  The patient's family history includes Heart disease in her brother.    ROS:  Please see the history of present illness.   Otherwise, review of systems are positive for none.   All other systems are reviewed and negative.    PHYSICAL EXAM: VS:  BP 132/86   Pulse 72   Ht 4\' 11"  (1.499 m)   Wt 70.8 kg (156 lb)   BMI 31.51 kg/m  , BMI Body mass index is 31.51 kg/m. GENERAL:  Well appearing.  No acute distress.   HEENT:  Pupils equal round and reactive, fundi not visualized, oral mucosa unremarkable NECK:  No jugular  venous distention, waveform within normal limits, carotid upstroke brisk and symmetric LYMPHATICS:  No cervical adenopathy LUNGS:  Clear to auscultation bilaterally HEART:  RRR.  PMI not displaced or sustained,S1 and S2 within normal limits, no S3, no S4, no clicks, no rubs, III/VI late-peaking systolic murmur at the left and right upper sternal borders. +Radiation to bilateral carotids.  ABD:  Flat, positive bowel  sounds normal in frequency in pitch, no bruits, no rebound, no guarding, no midline pulsatile mass, no hepatomegaly, no splenomegaly EXT:  2 plus pulses throughout, no edema, no cyanosis no clubbing SKIN:  No rashes no nodules NEURO:  Cranial nerves II through XII grossly intact, motor grossly intact throughout PSYCH:  Cognitively intact, oriented to person place and time  EKG:  EKG is not ordered today. The ekg ordered 03/12/16 demonstrates sinus rhythm. Rate 78 bpm.   Recent Labs: No results found for requested labs within last 8760 hours.    Lipid Panel No results found for: CHOL, TRIG, HDL, CHOLHDL, VLDL, LDLCALC, LDLDIRECT    Wt Readings from Last 3 Encounters:  05/25/16 70.8 kg (156 lb)  04/20/16 70.1 kg (154 lb 9.6 oz)  03/12/16 69.7 kg (153 lb 9.6 oz)      ASSESSMENT AND PLAN:  # Subaortic membrane: Whitney Juarez' gradients across her aortic valve are severely elevated. Her aortic valve is normal but she has a subaortic membrane.  She is asymptomatic and they are likely to recur after surgery.  Therefore we will not pursue any surgical intervention at this time.  We discussed heart failure symptoms and she will call if this occurs.  # Hypertension: Blood pressure is above goal.  She thinks that it is high at the doctor because of anxiety when she sees the doctor.  I have asked her to keep a log of her BP at home.  Goal <130/80.  Continue diltiazem.  # Paroxysmal atrial fibrillation: Whitney Juarez has not experienced any recurrent episodes of atrial fibrillation. Her anticoagulation was stopped after 30 days given that her only risk factor was female sex. It is unclear whether she has hypertension.  I suspect that she does.  This would be a CHA2DS2-Vasc of 2 and she should be on anticoagulation.  Continue diltiazem.   Current medicines are reviewed at length with the patient today.  The patient does not have concerns regarding medicines.  The following changes have been made:  no  change  Labs/ tests ordered today include:   No orders of the defined types were placed in this encounter.    Disposition:   FU with Milanie Rosenfield C. Oval Linsey, MD, [facility] in 1 year.   This note was written with the assistance of speech recognition software.  Please excuse any transcriptional errors.  Signed, Teresa Lemmerman C. Oval Linsey, MD, [facility]  05/26/2016 1:10 PM     [facility]

## 2016-05-26 ENCOUNTER — Encounter: Payer: Self-pay | Admitting: Cardiovascular Disease

## 2016-05-26 DIAGNOSIS — Q244 Congenital subaortic stenosis: Secondary | ICD-10-CM

## 2016-05-26 HISTORY — DX: Congenital subaortic stenosis: Q24.4

## 2016-06-29 ENCOUNTER — Emergency Department (HOSPITAL_COMMUNITY)
Admission: EM | Admit: 2016-06-29 | Discharge: 2016-06-29 | Disposition: A | Discharge status: Home / Self Care | Payer: BLUE CROSS/BLUE SHIELD | Attending: Emergency Medicine | Admitting: Emergency Medicine

## 2016-06-29 ENCOUNTER — Encounter (HOSPITAL_COMMUNITY): Payer: Self-pay

## 2016-06-29 ENCOUNTER — Emergency Department (HOSPITAL_COMMUNITY): Payer: BLUE CROSS/BLUE SHIELD

## 2016-06-29 DIAGNOSIS — R0789 Other chest pain: Secondary | ICD-10-CM | POA: Diagnosis not present

## 2016-06-29 DIAGNOSIS — N838 Other noninflammatory disorders of ovary, fallopian tube and broad ligament: Secondary | ICD-10-CM | POA: Insufficient documentation

## 2016-06-29 DIAGNOSIS — D124 Benign neoplasm of descending colon: Secondary | ICD-10-CM

## 2016-06-29 DIAGNOSIS — Y92410 Unspecified street and highway as the place of occurrence of the external cause: Secondary | ICD-10-CM | POA: Diagnosis not present

## 2016-06-29 DIAGNOSIS — Z79899 Other long term (current) drug therapy: Secondary | ICD-10-CM | POA: Diagnosis not present

## 2016-06-29 DIAGNOSIS — I10 Essential (primary) hypertension: Secondary | ICD-10-CM | POA: Insufficient documentation

## 2016-06-29 DIAGNOSIS — S3991XA Unspecified injury of abdomen, initial encounter: Secondary | ICD-10-CM | POA: Diagnosis not present

## 2016-06-29 DIAGNOSIS — N23 Unspecified renal colic: Secondary | ICD-10-CM | POA: Diagnosis not present

## 2016-06-29 DIAGNOSIS — Y999 Unspecified external cause status: Secondary | ICD-10-CM | POA: Insufficient documentation

## 2016-06-29 DIAGNOSIS — R079 Chest pain, unspecified: Secondary | ICD-10-CM | POA: Diagnosis not present

## 2016-06-29 DIAGNOSIS — K635 Polyp of colon: Secondary | ICD-10-CM | POA: Diagnosis not present

## 2016-06-29 DIAGNOSIS — N949 Unspecified condition associated with female genital organs and menstrual cycle: Secondary | ICD-10-CM

## 2016-06-29 DIAGNOSIS — Y939 Activity, unspecified: Secondary | ICD-10-CM | POA: Insufficient documentation

## 2016-06-29 LAB — BASIC METABOLIC PANEL
ANION GAP: 8 (ref 5–15)
BUN: 21 mg/dL — ABNORMAL HIGH (ref 6–20)
CALCIUM: 9.1 mg/dL (ref 8.9–10.3)
CHLORIDE: 107 mmol/L (ref 101–111)
CO2: 23 mmol/L (ref 22–32)
CREATININE: 0.67 mg/dL (ref 0.44–1.00)
GFR calc non Af Amer: >60 mL/min (ref >60)
Glucose, Bld: 116 mg/dL — ABNORMAL HIGH (ref 65–99)
Potassium: 3.6 mmol/L (ref 3.5–5.1)
SODIUM: 138 mmol/L (ref 135–145)

## 2016-06-29 LAB — I-STAT BETA HCG BLOOD, ED (MC, WL, AP ONLY)

## 2016-06-29 LAB — CBC
HCT: 41.2 % (ref 36.0–46.0)
HEMOGLOBIN: 14.2 g/dL (ref 12.0–15.0)
MCH: 29.2 pg (ref 26.0–34.0)
MCHC: 34.5 g/dL (ref 30.0–36.0)
MCV: 84.6 fL (ref 78.0–100.0)
PLATELETS: 217 10*3/uL (ref 150–400)
RBC: 4.87 MIL/uL (ref 3.87–5.11)
RDW: 13 % (ref 11.5–15.5)
WBC: 8.9 10*3/uL (ref 4.0–10.5)

## 2016-06-29 LAB — I-STAT TROPONIN, ED: TROPONIN I, POC: 0 ng/mL (ref 0.00–0.08)

## 2016-06-29 MED ORDER — TRAMADOL HCL 50 MG PO TABS: 50.0000 mg | ORAL_TABLET | Freq: Four times a day (QID) | ORAL | 0 refills | Status: DC | PRN

## 2016-06-29 MED ORDER — MORPHINE SULFATE (PF) 4 MG/ML IV SOLN
4.0000 mg | Freq: Once | INTRAVENOUS | Status: AC
  Administered 2016-06-29: 4 mg via INTRAVENOUS
  Filled 2016-06-29: qty 1

## 2016-06-29 MED ORDER — CYCLOBENZAPRINE HCL 5 MG PO TABS: 5.0000 mg | ORAL_TABLET | Freq: Three times a day (TID) | ORAL | 0 refills | Status: DC | PRN

## 2016-06-29 MED ORDER — SODIUM CHLORIDE 0.9 % IV BOLUS (SEPSIS)
1000.0000 mL | Freq: Once | INTRAVENOUS | Status: AC
  Administered 2016-06-29: 1000 mL via INTRAVENOUS

## 2016-06-29 MED ORDER — IOPAMIDOL (ISOVUE-300) INJECTION 61%
INTRAVENOUS | Status: AC
  Administered 2016-06-29: 100 mL
  Filled 2016-06-29: qty 100

## 2016-06-29 MED ORDER — IBUPROFEN 600 MG PO TABS: 600.0000 mg | ORAL_TABLET | Freq: Four times a day (QID) | ORAL | 0 refills | Status: DC | PRN

## 2016-06-29 MED ORDER — DIAZEPAM 5 MG/ML IJ SOLN
5.0000 mg | Freq: Once | INTRAMUSCULAR | Status: AC
  Administered 2016-06-29: 5 mg via INTRAVENOUS
  Filled 2016-06-29: qty 2

## 2016-06-29 NOTE — ED Notes (Signed)
Translator used, pt verbalized understanding of discharge instructions and follow care.

## 2016-06-29 NOTE — ED Notes (Signed)
Patient transported to CT 
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
## 2016-06-29 NOTE — Discharge Instructions (Signed)
Take Motrin for pain.   Take flexeril for muscle spasms.   Take tramadol for severe pain.   See your doctor.   Expect to be stiff and sore for several days   You have colon polyp and recommend colonoscopy to see.   You have incidental cyst in your ovary and kidney stone on the left side that can be followed up with your doctor.   Return to ER if you have worse chest pain, abdominal pain, back pain, trouble walking, vomiting, headaches

## 2016-06-29 NOTE — ED Notes (Signed)
CT NOT completed yet.
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
## 2016-09-30 ENCOUNTER — Other Ambulatory Visit: Payer: Self-pay | Admitting: Cardiovascular Disease

## 2016-10-01 NOTE — Telephone Encounter (Signed)
REFILL 

## 2016-10-01 NOTE — Telephone Encounter (Signed)
Please review for refill, thanks ! 

## 2017-10-10 ENCOUNTER — Other Ambulatory Visit: Payer: Self-pay | Admitting: Cardiovascular Disease

## 2018-10-21 ENCOUNTER — Other Ambulatory Visit: Payer: Self-pay | Admitting: Cardiovascular Disease

## 2018-12-27 ENCOUNTER — Other Ambulatory Visit: Payer: Self-pay | Admitting: Cardiovascular Disease

## 2019-10-07 ENCOUNTER — Other Ambulatory Visit: Payer: Self-pay

## 2019-10-07 ENCOUNTER — Emergency Department (HOSPITAL_COMMUNITY): Payer: BC Managed Care – PPO

## 2019-10-07 ENCOUNTER — Encounter (HOSPITAL_COMMUNITY): Payer: Self-pay

## 2019-10-07 ENCOUNTER — Emergency Department (HOSPITAL_COMMUNITY)
Admission: EM | Admit: 2019-10-07 | Discharge: 2019-10-08 | Disposition: A | Discharge status: Home / Self Care | Payer: BC Managed Care – PPO | Attending: Emergency Medicine | Admitting: Emergency Medicine

## 2019-10-07 DIAGNOSIS — R0789 Other chest pain: Secondary | ICD-10-CM | POA: Insufficient documentation

## 2019-10-07 DIAGNOSIS — R079 Chest pain, unspecified: Secondary | ICD-10-CM | POA: Diagnosis not present

## 2019-10-07 DIAGNOSIS — I10 Essential (primary) hypertension: Secondary | ICD-10-CM | POA: Diagnosis not present

## 2019-10-07 DIAGNOSIS — Z79899 Other long term (current) drug therapy: Secondary | ICD-10-CM | POA: Diagnosis not present

## 2019-10-07 DIAGNOSIS — Z5321 Procedure and treatment not carried out due to patient leaving prior to being seen by health care provider: Secondary | ICD-10-CM | POA: Insufficient documentation

## 2019-10-07 LAB — CBC
HCT: 42.2 % (ref 36.0–46.0)
Hemoglobin: 13.8 g/dL (ref 12.0–15.0)
MCH: 28 pg (ref 26.0–34.0)
MCHC: 32.7 g/dL (ref 30.0–36.0)
MCV: 85.6 fL (ref 80.0–100.0)
Platelets: 255 10*3/uL (ref 150–400)
RBC: 4.93 MIL/uL (ref 3.87–5.11)
RDW: 13.5 % (ref 11.5–15.5)
WBC: 8.2 10*3/uL (ref 4.0–10.5)
nRBC: 0 % (ref 0.0–0.2)

## 2019-10-07 LAB — BASIC METABOLIC PANEL
Anion gap: 9 (ref 5–15)
BUN: 10 mg/dL (ref 6–20)
CO2: 24 mmol/L (ref 22–32)
Calcium: 9 mg/dL (ref 8.9–10.3)
Chloride: 109 mmol/L (ref 98–111)
Creatinine, Ser: 0.68 mg/dL (ref 0.44–1.00)
GFR calc Af Amer: >60 mL/min (ref >60)
GFR calc non Af Amer: >60 mL/min (ref >60)
Glucose, Bld: 122 mg/dL — ABNORMAL HIGH (ref 70–99)
Potassium: 3.6 mmol/L (ref 3.5–5.1)
Sodium: 142 mmol/L (ref 135–145)

## 2019-10-07 LAB — TROPONIN I (HIGH SENSITIVITY)
Troponin I (High Sensitivity): 6 ng/L (ref <18)
Troponin I (High Sensitivity): 6 ng/L (ref <18)

## 2019-10-07 LAB — I-STAT BETA HCG BLOOD, ED (MC, WL, AP ONLY): I-stat hCG, quantitative: <5 m[IU]/mL (ref <5)

## 2019-10-07 NOTE — ED Triage Notes (Addendum)
Pt arrives POV for eval of HTN and substernal chest pressure throughout the day. Pt reports she has been scared to go to the cardiologist, so she has not been taking medicine for her blood pressure. Pt was started on amlodipine for HTN in February, but never followed up and never got refill for script.

## 2019-10-16 ENCOUNTER — Encounter: Payer: Self-pay | Admitting: Internal Medicine

## 2019-10-16 ENCOUNTER — Ambulatory Visit (INDEPENDENT_AMBULATORY_CARE_PROVIDER_SITE_OTHER): Payer: BC Managed Care – PPO | Admitting: Internal Medicine

## 2019-10-16 ENCOUNTER — Other Ambulatory Visit: Payer: Self-pay

## 2019-10-16 VITALS — BP 124/80 | HR 88 | Ht 59.0 in | Wt 164.0 lb

## 2019-10-16 DIAGNOSIS — I10 Essential (primary) hypertension: Secondary | ICD-10-CM

## 2019-10-16 DIAGNOSIS — Q244 Congenital subaortic stenosis: Secondary | ICD-10-CM | POA: Diagnosis not present

## 2019-10-16 DIAGNOSIS — I351 Nonrheumatic aortic (valve) insufficiency: Secondary | ICD-10-CM | POA: Diagnosis not present

## 2019-10-16 DIAGNOSIS — I480 Paroxysmal atrial fibrillation: Secondary | ICD-10-CM

## 2019-10-16 MED ORDER — DILTIAZEM HCL ER COATED BEADS 240 MG PO CP24
ORAL_CAPSULE | ORAL | 1 refills | Status: DC
Start: 2019-10-16 — End: 2020-06-13

## 2019-10-16 MED ORDER — APIXABAN 5 MG PO TABS: 5.0000 mg | ORAL_TABLET | Freq: Two times a day (BID) | ORAL | 5 refills | Status: DC

## 2019-10-16 NOTE — Patient Instructions (Signed)
Medication Instructions:  Your physician has recommended you make the following change in your medication:   1. RESTART: diltiazem 240 mg once a day  2. START: eliquis 5 mg twice a day  Patients taking blood thinners should generally stay away from medicines like ibuprofen, Advil, Motrin, naproxen, and Aleve due to risk of stomach bleeding. You may take Tylenol as directed or talk to your primary doctor about alternatives.  If you notice any bleeding such as blood in stool, black tarry stools, blood in urine, nosebleeds or any other unusual bleeding, call your doctor immediately. It is not normal to have this kind of bleeding while on a blood thinner and usually indicates there is an underlying problem with one of your body systems that needs to be checked out.   *If you need a refill on your cardiac medications before your next appointment, please call your pharmacy*   Lab Work: None  If you have labs (blood work) drawn today and your tests are completely normal, you will receive your results only by: Marland Kitchen MyChart Message (if you have MyChart) OR . A paper copy in the mail If you have any lab test that is abnormal or we need to change your treatment, we will call you to review the results.   Testing/Procedures: Your physician has requested that you have an echocardiogram. Echocardiography is a painless test that uses sound waves to create images of your heart. It provides your doctor with information about the size and shape of your heart and how well your heart's chambers and valves are working. This procedure takes approximately one hour. There are no restrictions for this procedure.  Follow-Up: At [facility], you and your health needs are our priority.  As part of our continuing mission to provide you with exceptional heart care, we have created designated Provider Care Teams.  These Care Teams include your primary Cardiologist (physician) and Advanced Practice Providers (APPs -   Physician Assistants and Nurse Practitioners) who all work together to provide you with the care you need, when you need it.  We recommend signing up for the patient portal called "MyChart".  Sign up information is provided on this After Visit Summary.  MyChart is used to connect with patients for Virtual Visits (Telemedicine).  Patients are able to view lab/test results, encounter notes, upcoming appointments, etc.  Non-urgent messages can be sent to your provider as well.   To learn more about what you can do with MyChart, go to [email or web address].    Your next appointment:   6 month(s)  The format for your next appointment:   In Person  Provider:   Rudean Haskell, MD   Other Instructions None

## 2019-10-16 NOTE — Progress Notes (Signed)
Cardiology Office Note:    Date:  10/16/2019   ID:  Whitney Juarez, DOB 1965/11/11, MRN [id number]  PCP:  Patient, No Pcp Per  [place] Cardiologist:  No primary care provider on file.  [facility] Electrophysiologist:  None   Referring MD: No ref. provider found   CC: Worsening anxiety and palpitations Seen to re-establish care in the setting of subaortic membranes and PAF; previously seen by Dr. Oval Linsey.  Provider Note:  Patient deferred Tele-Interpretor service, and would prefer to use her daughter.    History of Present Illness:    Whitney Juarez is a 54 y.o. female with a hx of paroxysmal atrial fibrillation (CHADSVASC of 2 for HTN, F on ASA) Subaortic membrance s(elevated gradients 2017, TEE 2/218 Mod AI, CCTA with anterior LVOT subaortic membranes), and moderate aortic insufficiency last seen 2018.  Since interval visit, had ED visit for high blood pressure and chest pain 10/07/2019.    Presently, patient feels occasional shortness of breath.  Felt shortness of breath and heart racing.  Has not had this issue since February.  Presently, she feels anxious and needs to breath heavy.  Ran out of diltiazem.  On it, had no shortness of breath, no DOE no palpitations.    In 2018 no SOB.  Patient has no bleeding issues.  No coughing up blood, blood in urine or stool.  Patient not on blood thinner or ASA, but unsure why.  Past Medical History:  Diagnosis Date  . Aortic regurgitation 04/20/2016  . Atrial fibrillation ([place]) 05/27/2015  . Hypertension 05/27/2015  . Severe aortic stenosis 03/12/2016  . 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 CESAREAN SECTION    . TEE WITHOUT CARDIOVERSION N/A 03/27/2016   Procedure: TRANSESOPHAGEAL ECHOCARDIOGRAM (TEE);  Surgeon: Skeet Latch, MD;  Location: [facility] ENDOSCOPY;  Service: Cardiovascular;  Laterality: N/A;    Current Medications: Current Meds  Medication Sig  . diltiazem  (CARDIZEM CD) 240 MG 24 hr capsule TAKE 1 CAPSULE BY MOUTH EVERY DAY  . ibuprofen (ADVIL,MOTRIN) 600 MG tablet Take 1 tablet (600 mg total) by mouth every 6 (six) hours as needed.     Allergies:   Patient has no known allergies.   Social History   Socioeconomic History  . Marital status: Married    Spouse name: Not on file  . Number of children: Not on file  . Years of education: Not on file  . Highest education level: Not on file  Occupational History  . Not on file  Tobacco Use  . Smoking status: Never Smoker  . Smokeless tobacco: Never Used  Substance and Sexual Activity  . Alcohol use: No  . Drug use: No  . Sexual activity: Not on file  Other Topics Concern  . Not on file  Social History Narrative  . Not on file   Social Determinants of Health   Financial Resource Strain:   . Difficulty of Paying Living Expenses: Not on file  Food Insecurity:   . Worried About Charity fundraiser in the Last Year: Not on file  . Ran Out of Food in the Last Year: Not on file  Transportation Needs:   . Lack of Transportation (Medical): Not on file  . Lack of Transportation (Non-Medical): Not on file  Physical Activity:   . Days of Exercise per Week: Not on file  . Minutes of Exercise per Session: Not on file  Stress:   . Feeling  of Stress : Not on file  Social Connections:   . Frequency of Communication with Friends and Family: Not on file  . Frequency of Social Gatherings with Friends and Family: Not on file  . Attends Religious Services: Not on file  . Active Member of Clubs or Organizations: Not on file  . Attends Archivist Meetings: Not on file  . Marital Status: Not on file     Family History: The patient'sfamily history includes Heart disease in her brother. No arrythmia  ROS:   Please see the history of present illness.    All other systems reviewed and are negative.  EKGs/Labs/Other Studies Reviewed:    The following studies were reviewed  today:  10/15/19- SR, rate 92, TWI in inferior leads 05/27/15 A fib rate 186 with inferolateral ST depression and TWI; only  A fib EKG seen in chart review  Recent Labs: 10/07/2019: BUN 10; Creatinine, Ser 0.68; Hemoglobin 13.8; Platelets 255; Potassium 3.6; Sodium 142  Recent Lipid Panel No results found for: CHOL, TRIG, HDL, CHOLHDL, VLDL, LDLCALC, LDLDIRECT  Structural CCTA:  Small anterior membrane ridge in LVOT; personally reviewed. 03/27/16 TEE;  Trileaflet valve with AoV turbulent flow and eccentric AI.  Prominent Eustachian Valve.  Physical Exam:    VS:  BP 124/80   Pulse 88   Ht 4\' 11"  (1.499 m)   Wt 164 lb (74.4 kg)   SpO2 98%   BMI 33.12 kg/m     Wt Readings from Last 3 Encounters:  10/16/19 164 lb (74.4 kg)  10/07/19 180 lb (81.6 kg)  05/25/16 156 lb (70.8 kg)    GEN: Obese Female, well developed in no acute distress HEENT: Normal NECK: No JVD; No carotid bruits LYMPHATICS: No lymphadenopathy CARDIAC: RRR, III/VI systolic murmur that does not change with hand grip; no rubs, gallops, +2 pulses bilaterally (radial) RESPIRATORY:  Clear to auscultation without rales, wheezing or rhonchi  ABDOMEN: Soft, non-tender, non-distended MUSCULOSKELETAL:  No edema; No deformity  SKIN: Warm and dry NEUROLOGIC:  Alert and oriented x 3 PSYCHIATRIC:  Normal affect    ASSESSMENT:    1. Subaortic stenosis   2. Nonrheumatic aortic valve insufficiency   3. Paroxysmal atrial fibrillation (HCC)   4. Essential hypertension    PLAN:    In order of problems listed above:  1. Subaortic membranes  - with Moderate Aortic regurgitation; likely asymptomatic given description of SOB and present pulse pressure - will repeat echocardiogram 2.  PAF CHADSVASC of 2 (F, HTN) - SHARED DECISION MAKING: discussed risk of stroke given HTN and Gender, will trial DOAC (Eliquis 5 mg) and if cost prohibitive will try Warfarin (after discussion of warfarin vs aspirin with patient and daughter) 3.  HTN- will restart diltiazem  6 month follow up  Medication Adjustments/Labs and Tests Ordered: Current medicines are reviewed at length with the patient today.  Concerns regarding medicines are outlined above.  No orders of the defined types were placed in this encounter.  No orders of the defined types were placed in this encounter.   There are no Patient Instructions on file for this visit.   Signed, Werner Lean, MD  10/16/2019 1:45 PM    [facility]

## 2019-11-04 ENCOUNTER — Other Ambulatory Visit (HOSPITAL_COMMUNITY): Payer: BC Managed Care – PPO

## 2019-11-04 ENCOUNTER — Telehealth: Payer: Self-pay | Admitting: Internal Medicine

## 2019-11-04 ENCOUNTER — Encounter (HOSPITAL_COMMUNITY): Payer: Self-pay | Admitting: Internal Medicine

## 2019-11-04 NOTE — Telephone Encounter (Signed)
Certainly.  Not a problem.- MAC

## 2019-11-04 NOTE — Telephone Encounter (Signed)
Patient daughter called stating her mother would like to switch from Dr. Gasper Sells to Dr. Oval Linsey.  She was a past patient from Dr. Oval Linsey last seen in 2018 by Dr. Oval Linsey.  Are you both in agreement?

## 2019-11-04 NOTE — Telephone Encounter (Signed)
Sure. Thank you!

## 2019-11-23 ENCOUNTER — Other Ambulatory Visit: Payer: Self-pay

## 2019-11-23 ENCOUNTER — Ambulatory Visit (HOSPITAL_COMMUNITY): Payer: BC Managed Care – PPO | Attending: Cardiology

## 2019-11-23 DIAGNOSIS — I351 Nonrheumatic aortic (valve) insufficiency: Secondary | ICD-10-CM | POA: Diagnosis not present

## 2019-11-23 DIAGNOSIS — Q244 Congenital subaortic stenosis: Secondary | ICD-10-CM | POA: Diagnosis not present

## 2019-11-23 LAB — ECHOCARDIOGRAM COMPLETE
AR max vel: 0.97 cm2
AV Area VTI: 1.09 cm2
AV Area mean vel: 1.05 cm2
AV Mean grad: 43 mmHg
AV Peak grad: 72.6 mmHg
Ao pk vel: 4.26 m/s
Area-P 1/2: 3.6 cm2
P 1/2 time: 459 msec
S' Lateral: 3.6 cm

## 2019-12-03 ENCOUNTER — Ambulatory Visit: Payer: BC Managed Care – PPO | Admitting: Cardiovascular Disease

## 2019-12-03 NOTE — Progress Notes (Deleted)
Cardiology Office Note   Date:  12/03/2019   ID:  Whitney Juarez, DOB 1965-06-06, MRN [id number]  PCP:  Patient, No Pcp Per  Cardiologist:   Skeet Latch, MD   No chief complaint on file.     History of Present Illness: Whitney Juarez is a 54 y.o. female with severely elevated aortic valve gradients and mild-moderate aortic regurgitation, hypertension and paroxysmal atrial fibrillation who presents for follow-up. She presented to the emergency department 05/2015 with new onset of atrial fibrillation with rapid ventricular response. She was started on a diltiazem drip and underwent cardioversion in the emergency department. She was started on diltiazem and Eliquis at that time.  She was seen in the atrial fibrillation clinic 05/31/15 and instructed to stop Eliquis after 1 month due to [facility] of 1.  She had an outpatient echocardiogram 07/11/15 that revealed LVEF 60-65% with grade 2 diastolic dysfunction. She also had mild aortic regurgitation and moderate to severe aortic stenosis (mean gradient 37 mmHg, peak velocity 3.7 m/s).  She saw Dr. Johnsie Cancel 07/2015 and was doing well.  She had a follow-up echocardiogram 03/05/16 that revealed LVEF 60-65% with severe aortic stenosis and moderate aortic regurgitation.  However visually her valve did not appear to be severely stenotic and transesophageal echocardiogram was recommended.  She underwent TEE 03/27/16 that revealed a normal aortic valve but severely elevated aortic valve gradients and mild to moderate aortic regurgitation.  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  She had a normal aortic valve no CAD.  Whitney Juarez was last seen in 2018 and was doing well.  At that time she had no symptoms from her subaortic membrane and palpitations were well-controlled.  Therefore no further intervention was recommended and she was asked to follow-up in 1 year.  She followed up with Dr.Chandrasekhar  on 10/2019 and noted occasional shortness of breath and heart racing.  She also reported some palpitations.  Of note she was out of her diltiazem at the time.  She had an echo 11/2019 that revealed LVEF 60 to 65% with grade 2 diastolic dysfunction.  PASP was 41 mmHg.  Her aortic valve had mild to moderate aortic regurgitation.  The mean gradient across her subaortic membrane was 43 mmHg.  Past Medical History:  Diagnosis Date  . Aortic regurgitation 04/20/2016  . Atrial fibrillation ([place]) 05/27/2015  . Hypertension 05/27/2015  . Severe aortic stenosis 03/12/2016  . 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 CESAREAN SECTION    . TEE WITHOUT CARDIOVERSION N/A 03/27/2016   Procedure: TRANSESOPHAGEAL ECHOCARDIOGRAM (TEE);  Surgeon: Skeet Latch, MD;  Location: [facility] ENDOSCOPY;  Service: Cardiovascular;  Laterality: N/A;     Current Outpatient Medications  Medication Sig Dispense Refill  . apixaban (ELIQUIS) 5 MG TABS tablet Take 1 tablet (5 mg total) by mouth 2 (two) times daily. 60 tablet 5  . diltiazem (CARDIZEM CD) 240 MG 24 hr capsule TAKE 1 CAPSULE BY MOUTH EVERY DAY 90 capsule 1  . ibuprofen (ADVIL,MOTRIN) 600 MG tablet Take 1 tablet (600 mg total) by mouth every 6 (six) hours as needed. 30 tablet 0   No current facility-administered medications for this visit.    Allergies:   Patient has no known allergies.    Social History:  The patient  reports that she has never smoked. She has never used smokeless tobacco. She reports that she does not drink alcohol and  does not use drugs.   Family History:  The patient's family history includes Heart disease in her brother.    ROS:  Please see the history of present illness.   Otherwise, review of systems are positive for none.   All other systems are reviewed and negative.    PHYSICAL EXAM: VS:  There were no vitals taken for this visit. , BMI There is no height or weight on file to calculate  BMI. GENERAL:  Well appearing.  No acute distress.   HEENT:  Pupils equal round and reactive, fundi not visualized, oral mucosa unremarkable NECK:  No jugular venous distention, waveform within normal limits, carotid upstroke brisk and symmetric LYMPHATICS:  No cervical adenopathy LUNGS:  Clear to auscultation bilaterally HEART:  RRR.  PMI not displaced or sustained,S1 and S2 within normal limits, no S3, no S4, no clicks, no rubs, III/VI late-peaking systolic murmur at the left and right upper sternal borders. +Radiation to bilateral carotids.  ABD:  Flat, positive bowel sounds normal in frequency in pitch, no bruits, no rebound, no guarding, no midline pulsatile mass, no hepatomegaly, no splenomegaly EXT:  2 plus pulses throughout, no edema, no cyanosis no clubbing SKIN:  No rashes no nodules NEURO:  Cranial nerves II through XII grossly intact, motor grossly intact throughout PSYCH:  Cognitively intact, oriented to person place and time  EKG:  EKG is not ordered today. The ekg ordered 03/12/16 demonstrates sinus rhythm. Rate 78 bpm.   Recent Labs: 10/07/2019: BUN 10; Creatinine, Ser 0.68; Hemoglobin 13.8; Platelets 255; Potassium 3.6; Sodium 142    Lipid Panel No results found for: CHOL, TRIG, HDL, CHOLHDL, VLDL, LDLCALC, LDLDIRECT    Wt Readings from Last 3 Encounters:  10/16/19 164 lb (74.4 kg)  10/07/19 180 lb (81.6 kg)  05/25/16 156 lb (70.8 kg)      ASSESSMENT AND PLAN:  # Subaortic membrane: Whitney Juarez' gradients across her aortic valve are severely elevated. Her aortic valve is normal but she has a subaortic membrane.  She is asymptomatic and they are likely to recur after surgery.  Therefore we will not pursue any surgical intervention at this time.  We discussed heart failure symptoms and she will call if this occurs.  # Hypertension: Blood pressure is above goal.  She thinks that it is high at the doctor because of anxiety when she sees the doctor.  I have asked her to keep  a log of her BP at home.  Goal <130/80.  Continue diltiazem.  # Paroxysmal atrial fibrillation: Whitney Juarez has not experienced any recurrent episodes of atrial fibrillation. Her anticoagulation was stopped after 30 days given that her only risk factor was female sex. It is unclear whether she has hypertension.  I suspect that she does.  This would be a CHA2DS2-Vasc of 2 and she should be on anticoagulation.  Continue diltiazem.   Current medicines are reviewed at length with the patient today.  The patient does not have concerns regarding medicines.  The following changes have been made:  no change  Labs/ tests ordered today include:   No orders of the defined types were placed in this encounter.    Disposition:   FU with Edahi Kroening C. Oval Linsey, MD, [facility] in 1 year.   This note was written with the assistance of speech recognition software.  Please excuse any transcriptional errors.  Signed, Angellica Maddison C. Oval Linsey, MD, [facility]  12/03/2019 3:22 PM    [facility]

## 2020-04-27 ENCOUNTER — Other Ambulatory Visit: Payer: Self-pay

## 2020-04-27 ENCOUNTER — Telehealth: Payer: Self-pay

## 2020-04-27 ENCOUNTER — Encounter: Payer: Self-pay | Admitting: Internal Medicine

## 2020-04-27 ENCOUNTER — Ambulatory Visit (INDEPENDENT_AMBULATORY_CARE_PROVIDER_SITE_OTHER): Payer: BC Managed Care – PPO | Admitting: Internal Medicine

## 2020-04-27 VITALS — BP 126/70 | HR 70 | Ht 59.0 in | Wt 164.0 lb

## 2020-04-27 DIAGNOSIS — I10 Essential (primary) hypertension: Secondary | ICD-10-CM

## 2020-04-27 DIAGNOSIS — I351 Nonrheumatic aortic (valve) insufficiency: Secondary | ICD-10-CM | POA: Diagnosis not present

## 2020-04-27 DIAGNOSIS — I480 Paroxysmal atrial fibrillation: Secondary | ICD-10-CM | POA: Diagnosis not present

## 2020-04-27 DIAGNOSIS — Q244 Congenital subaortic stenosis: Secondary | ICD-10-CM | POA: Diagnosis not present

## 2020-04-27 NOTE — Patient Instructions (Signed)
Medication Instructions:  Your physician recommends that you continue on your current medications as directed. Please refer to the Current Medication list given to you today.  *If you need a refill on your cardiac medications before your next appointment, please call your pharmacy*   Lab Work: none If you have labs (blood work) drawn today and your tests are completely normal, you will receive your results only by: Marland Kitchen MyChart Message (if you have MyChart) OR . A paper copy in the mail If you have any lab test that is abnormal or we need to change your treatment, we will call you to review the results.   Testing/Procedures: none   Follow-Up: At [facility], you and your health needs are our priority.  As part of our continuing mission to provide you with exceptional heart care, we have created designated Provider Care Teams.  These Care Teams include your primary Cardiologist (physician) and Advanced Practice Providers (APPs -  Physician Assistants and Nurse Practitioners) who all work together to provide you with the care you need, when you need it.  We recommend signing up for the patient portal called "MyChart".  Sign up information is provided on this After Visit Summary.  MyChart is used to connect with patients for Virtual Visits (Telemedicine).  Patients are able to view lab/test results, encounter notes, upcoming appointments, etc.  Non-urgent messages can be sent to your provider as well.   To learn more about what you can do with MyChart, go to [email or web address].    Your next appointment:   1 month(s)  The format for your next appointment:   In Person  Provider:   Skeet Latch, MD... transferring care to her and needing second opinion so the sooner the better   Other Instructions

## 2020-04-27 NOTE — Progress Notes (Signed)
Cardiology Office Note:    Date:  04/27/2020   ID:  Whitney Juarez, DOB 1966-02-02, MRN [id number]  PCP:  Patient, No Pcp Per  [place] Cardiologist:  No primary care provider on file.  [facility] Electrophysiologist:  None   CC; Follow up [place] and subaortic membranes.   Provider Note:  Patient deferred Tele-Interpretor service, and would prefer to use her daughter   History of Present Illness:    Whitney Juarez is a 55 y.o. female with a hx of paroxysmal atrial fibrillation (CHADSVASC of 2 for HTN, F on ASA) Subaortic membrance s(elevated gradients 2017, TEE 2/218 Mod AI, CCTA with anterior LVOT subaortic membranes), and moderate aortic insufficiency last seen 2018. Seen in 10/2019.   In interim of this visit, patient has requested to transfer her care back to Dr. Oval Linsey.  Echo performed that showed evidence of increase in LVOT velocities concerning for worsening of her subaortic stenosis.  Dr. Oval Linsey and team has called patient back with results and scheduled 12/02/20 (no show).  Patient notes that she is doing great .  Since last visit notes that wasn't quite sure the results of her echocardiogram; missed her appointment because her daughter couldn't get off work. Relevant interval testing or therapy include only the echo.  There are no interval hospital/ED visit.    No chest pain or pressure .  No SOB/DOE and no PND/Orthopnea.  No weight gain or leg swelling.  No palpitations or syncope.  No bleeding on Eliquis.   Past Medical History:  Diagnosis Date  . Aortic regurgitation 04/20/2016  . Atrial fibrillation ([place]) 05/27/2015  . Hypertension 05/27/2015  . Severe aortic stenosis 03/12/2016  . 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 CESAREAN SECTION    . TEE WITHOUT CARDIOVERSION N/A 03/27/2016   Procedure: TRANSESOPHAGEAL ECHOCARDIOGRAM (TEE);  Surgeon: Skeet Latch, MD;  Location: [facility] ENDOSCOPY;  Service:  Cardiovascular;  Laterality: N/A;    Current Medications: Current Meds  Medication Sig  . apixaban (ELIQUIS) 5 MG TABS tablet Take 1 tablet (5 mg total) by mouth 2 (two) times daily.  Marland Kitchen diltiazem (CARDIZEM CD) 240 MG 24 hr capsule TAKE 1 CAPSULE BY MOUTH EVERY DAY  . ibuprofen (ADVIL,MOTRIN) 600 MG tablet Take 1 tablet (600 mg total) by mouth every 6 (six) hours as needed.     Allergies:   Patient has no known allergies.   Social History   Socioeconomic History  . Marital status: Married    Spouse name: Not on file  . Number of children: Not on file  . Years of education: Not on file  . Highest education level: Not on file  Occupational History  . Not on file  Tobacco Use  . Smoking status: Never Smoker  . Smokeless tobacco: Never Used  Substance and Sexual Activity  . Alcohol use: No  . Drug use: No  . Sexual activity: Not on file  Other Topics Concern  . Not on file  Social History Narrative  . Not on file   Social Determinants of Health   Financial Resource Strain: Not on file  Food Insecurity: Not on file  Transportation Needs: Not on file  Physical Activity: Not on file  Stress: Not on file  Social Connections: Not on file     Family History: The patient'sfamily history includes Heart disease in her brother. No arrythmia  ROS:   Please see the history of present illness.  All other systems reviewed and are negative.  EKGs/Labs/Other Studies Reviewed:    The following studies were reviewed today:  10/15/19- SR, rate 92, TWI in inferior leads 05/27/15 A fib rate 186 with inferolateral ST depression and TWI; only  A fib EKG seen in chart review  Transthoracic Echocardiogram: Date: 11/23/2019 Results: 1. Left ventricular ejection fraction, by estimation, is 60 to 65%. The  left ventricle has normal function. The left ventricle has no regional  wall motion abnormalities. There is mild left ventricular hypertrophy.  Left ventricular diastolic  parameters  are consistent with Grade II diastolic dysfunction (pseudonormalization).  2. Right ventricular systolic function is normal. The right ventricular  size is normal. There is mildly elevated pulmonary artery systolic  pressure. The estimated right ventricular systolic pressure is 10.6 mmHg.  3. The mitral valve is normal in structure. No evidence of mitral valve  regurgitation. No evidence of mitral stenosis.  4. The aortic valve is tricuspid. Aortic valve regurgitation is mild to  moderate. No valvular aortic stenosis is present (valve opens well) but  there is turbulence across the LV outflow tract. I cannot definitively  visualize it, but suspect that there  is a subaortic membrane present creating severe subaortic stenosis. Aortic  valve mean gradient measures 43.0 mmHg. With pulmonary hypertension  present, would consider surgical evaluation.  5. The inferior vena cava is normal in size with greater than 50%  respiratory variability, suggesting right atrial pressure of 3 mmHg.   Transesophageal Echocardiogram: Date: 03/27/2016 Results: Study Conclusions   - Aortic valve: There was mild to moderate regurgitation  originating from the central coaptation point.  - Mitral valve: No evidence of vegetation.  - Left atrium: No evidence of thrombus in the atrial cavity or  appendage. No evidence of thrombus in the appendage.  - Right atrium: No evidence of thrombus in the atrial cavity or  appendage.  - Atrial septum: No defect or patent foramen ovale was identified  by color flow Doppler.  - Pulmonic valve: No evidence of vegetation.   Impressions:   - Aortic valve gradients are severely elevated. However, there is  no aortic stenosis and no subvalvular membrate is noted. Consider  CT to better assess.   Cardiac CT: Date: 05/09/2016 Results: IMPRESSION: 1. There is a thin subaortic membrane in the anterior portion of the LVOT, located 4 mm bellow the  right coronary cusp measuring 7 mm (screenshot images are attached in PACS).  2. Coronary calcium score of 0. This was 0 percentile for age and sex matched control.  3. Normal coronary origin with right dominance.  4. No evidence of CAD.  Ena Dawley   Recent Labs: 10/07/2019: BUN 10; Creatinine, Ser 0.68; Hemoglobin 13.8; Platelets 255; Potassium 3.6; Sodium 142  Recent Lipid Panel No results found for: CHOL, TRIG, HDL, CHOLHDL, VLDL, LDLCALC, LDLDIRECT   Physical Exam:    VS:  BP 126/70   Pulse 70   Ht 4\' 11"  (1.499 m)   Wt 164 lb (74.4 kg)   SpO2 96%   BMI 33.12 kg/m     Wt Readings from Last 3 Encounters:  04/27/20 164 lb (74.4 kg)  10/16/19 164 lb (74.4 kg)  10/07/19 180 lb (81.6 kg)    GEN: Obese Female, well developed in no acute distress HEENT: DeMussets Sign NECK: No JVD; No carotid bruits LYMPHATICS: No lymphadenopathy CARDIAC: RRR, IIV/VI systolic murmur that does not change with hand grip; II/VI holodiastolic murmur; no rubs, gallops, +3 pulses bilaterally (radial)  RESPIRATORY:  Clear to auscultation without rales, wheezing or rhonchi  ABDOMEN: Soft, non-tender, non-distended MUSCULOSKELETAL:  No edema; No deformity  SKIN: Warm and dry NEUROLOGIC:  Alert and oriented x 3 PSYCHIATRIC:  Normal affect    ASSESSMENT:    1. Paroxysmal atrial fibrillation (HCC)   2. Subaortic stenosis   3. Nonrheumatic aortic valve insufficiency   4. Primary hypertension    PLAN:    In order of problems listed above:  Subaortic membranes - SHARED DECISION MAKING: concern that her AI and LVOT gradient have worsened both my clinical exam and 2021 echo.  Offered TEE re-assessment to better characterize AI component.  Discussed surgery and that surgical outcomes are better prior to serious symptoms.  Patient and daughter are aware; they would like to see Dr. Oval Linsey as she was the person who originally diagnosed the subaortic membranes    Paroxysmal Atrial  Fibrillation,  - Risk factors include HTN; Gender - CHADSVASC=2. - continue eliquis and diltiazem; presently in SR  Will send a note to Dr. Oval Linsey; patient to follow up with Dr. Oval Linsey for future care  Medication Adjustments/Labs and Tests Ordered: Current medicines are reviewed at length with the patient today.  Concerns regarding medicines are outlined above.  No orders of the defined types were placed in this encounter.  No orders of the defined types were placed in this encounter.   Patient Instructions  Medication Instructions:  Your physician recommends that you continue on your current medications as directed. Please refer to the Current Medication list given to you today.  *If you need a refill on your cardiac medications before your next appointment, please call your pharmacy*   Lab Work: none If you have labs (blood work) drawn today and your tests are completely normal, you will receive your results only by: Marland Kitchen MyChart Message (if you have MyChart) OR . A paper copy in the mail If you have any lab test that is abnormal or we need to change your treatment, we will call you to review the results.   Testing/Procedures: none   Follow-Up: At [facility], you and your health needs are our priority.  As part of our continuing mission to provide you with exceptional heart care, we have created designated Provider Care Teams.  These Care Teams include your primary Cardiologist (physician) and Advanced Practice Providers (APPs -  Physician Assistants and Nurse Practitioners) who all work together to provide you with the care you need, when you need it.  We recommend signing up for the patient portal called "MyChart".  Sign up information is provided on this After Visit Summary.  MyChart is used to connect with patients for Virtual Visits (Telemedicine).  Patients are able to view lab/test results, encounter notes, upcoming appointments, etc.  Non-urgent messages can be sent  to your provider as well.   To learn more about what you can do with MyChart, go to [email or web address].    Your next appointment:   1 month(s)  The format for your next appointment:   In Person  Provider:   Skeet Latch, MD... transferring care to her and needing second opinion so the sooner the better   Other Instructions      Signed, Werner Lean, MD  04/27/2020 9:48 AM    [place]

## 2020-04-27 NOTE — Telephone Encounter (Signed)
Spoke with the pt with Fraser Din from [employer]...re: her appt today with Dr. Gasper Sells. After he had reviewed his schedule it had been noted previously that she was also seeing Dr. Oval Linsey per her request.. she reported that she was feeling well but her daughter had taken the day off to bring her but she will talk with her... I advised her that if she decides to cancel we can make her a follow up appt with Dr. Oval Linsey.

## 2020-04-28 NOTE — Progress Notes (Signed)
Thanks [employer].    Rip Harbour, can we get her in to see me sometime in the next couple of months.  Thanks! Mckinsley Koelzer

## 2020-05-31 NOTE — Progress Notes (Signed)
Cardiology Clinic Note   Patient Name: Dejon Jungman Date of Encounter: 06/01/2020  Primary Care Provider:  Patient, No Pcp Per (Inactive) Primary Cardiologist:  Skeet Latch, MD  Patient Profile    [name] 55 year old female presents the clinic today for a follow-up evaluation of her subaortic stenosis, and paroxysmal atrial fibrillation.  Past Medical History    Past Medical History:  Diagnosis Date  . Aortic regurgitation 04/20/2016  . Atrial fibrillation ([place]) 05/27/2015  . Hypertension 05/27/2015  . Severe aortic stenosis 03/12/2016  . 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 CESAREAN SECTION    . TEE WITHOUT CARDIOVERSION N/A 03/27/2016   Procedure: TRANSESOPHAGEAL ECHOCARDIOGRAM (TEE);  Surgeon: Skeet Latch, MD;  Location: [facility] ENDOSCOPY;  Service: Cardiovascular;  Laterality: N/A;    Allergies  No Known Allergies  History of Present Illness    [name] is a PMH of paroxysmal atrial fibrillation CHA2DS2-VASc 2, HTN, subaortic stenosis, and moderate aortic insufficiency.  She was seen on 9/21.  She requested a transfer back to Dr. Oval Linsey.  Her echocardiogram showed evidence of increased LVOT velocities which were concerning for worsening subaortic stenosis.  Dr. Oval Linsey contacted patient for follow-up visit and to review results.  She was scheduled for visit 12/02/2020 and no showed.  She was seen by Dr.Chandrasekhar on 04/27/2020.  During that time she reported she was doing well.  She reported that she was not sure why her echocardiogram results meant.  She had missed her appointment because her daughter could not get off work.  She denied chest pain pressure.  She denied shortness of breath and dyspnea on exertion.  She denied orthopnea PND and lower extremity swelling.  She denied palpitations and syncope.  She reported compliance with Eliquis and denied bleeding issues.  A TEE was  recommended to better assess her AI.  She requested that she see Dr. Oval Linsey to discuss subaortic membranes, AI and worsening LVOT gradient.    She presents the clinic today for follow-up evaluation states she expected to see Dr. Oval Linsey.  We reviewed her previous echocardiogram and recommendations for repeating esophageal echocardiogram.  She was accompanied by interpreter and her daughter.  She expressed understanding.  She reports that she only notices fast or irregular heartbeats with bad news.  Her blood pressure initially is 170 over 74 and on recheck her blood pressure is 138/56.  On review of her previous blood pressure never fairly well controlled in the 182X systolic.  I will have her maintain a blood pressure log, order TEE, give the salty 6 diet sheet information, and have her increase her physical activity as tolerated.  We will have her follow-up after TEE with Dr. Oval Linsey.  Today she denies chest pain, shortness of breath, lower extremity edema, fatigue, palpitations, melena, hematuria, hemoptysis, diaphoresis, weakness, presyncope, syncope, orthopnea, and PND.   Home Medications    Prior to Admission medications   Medication Sig Start Date End Date Taking? Authorizing Provider  apixaban (ELIQUIS) 5 MG TABS tablet Take 1 tablet (5 mg total) by mouth 2 (two) times daily. 10/16/19   Chandrasekhar, Mahesh A, MD  diltiazem (CARDIZEM CD) 240 MG 24 hr capsule TAKE 1 CAPSULE BY MOUTH EVERY DAY 10/16/19   Chandrasekhar, Mahesh A, MD  ibuprofen (ADVIL,MOTRIN) 600 MG tablet Take 1 tablet (600 mg total) by mouth every 6 (six) hours as needed. 06/29/16   Drenda Freeze, MD    Family History  Family History  Problem Relation Age of Onset  . Heart disease Brother    She indicated that her mother is alive. She indicated that her father is alive. She indicated that her sister is alive. She indicated that all of her three brothers are alive.  Social History    Social History    Socioeconomic History  . Marital status: Married    Spouse name: Not on file  . Number of children: Not on file  . Years of education: Not on file  . Highest education level: Not on file  Occupational History  . Not on file  Tobacco Use  . Smoking status: Never Smoker  . Smokeless tobacco: Never Used  Substance and Sexual Activity  . Alcohol use: No  . Drug use: No  . Sexual activity: Not on file  Other Topics Concern  . Not on file  Social History Narrative  . Not on file   Social Determinants of Health   Financial Resource Strain: Not on file  Food Insecurity: Not on file  Transportation Needs: Not on file  Physical Activity: Not on file  Stress: Not on file  Social Connections: Not on file  Intimate Partner Violence: Not on file     Review of Systems    General:  No chills, fever, night sweats or weight changes.  Cardiovascular:  No chest pain, dyspnea on exertion, edema, orthopnea, palpitations, paroxysmal nocturnal dyspnea. Dermatological: No rash, lesions/masses Respiratory: No cough, dyspnea Urologic: No hematuria, dysuria Abdominal:   No nausea, vomiting, diarrhea, bright red blood per rectum, melena, or hematemesis Neurologic:  No visual changes, wkns, changes in mental status. All other systems reviewed and are otherwise negative except as noted above.  Physical Exam    VS:  BP (!) 138/56 (BP Location: Right Arm, Patient Position: Sitting, Cuff Size: Normal)   Pulse 80   Ht 4\' 11"  (6.962 m)   Wt 165 lb (74.8 kg)   BMI 33.33 kg/m  , BMI Body mass index is 33.33 kg/m. GEN: Well nourished, well developed, in no acute distress. HEENT: normal. Neck: Supple, no JVD, carotid bruits, or masses. Cardiac: RRR, no murmurs, rubs, or gallops. No clubbing, cyanosis, edema.  Radials/DP/PT 2+ and equal bilaterally.  Respiratory:  Respirations regular and unlabored, clear to auscultation bilaterally. GI: Soft, nontender, nondistended, BS + x 4. MS: no deformity  or atrophy. Skin: warm and dry, no rash. Neuro:  Strength and sensation are intact. Psych: Normal affect.  Accessory Clinical Findings    Recent Labs: 10/07/2019: BUN 10; Creatinine, Ser 0.68; Hemoglobin 13.8; Platelets 255; Potassium 3.6; Sodium 142   Recent Lipid Panel No results found for: CHOL, TRIG, HDL, CHOLHDL, VLDL, LDLCALC, LDLDIRECT  ECG personally reviewed by me today-none today.  Echocardiogram 11/23/2019 IMPRESSIONS    1. Left ventricular ejection fraction, by estimation, is 60 to 65%. The  left ventricle has normal function. The left ventricle has no regional  wall motion abnormalities. There is mild left ventricular hypertrophy.  Left ventricular diastolic parameters  are consistent with Grade II diastolic dysfunction (pseudonormalization).  2. Right ventricular systolic function is normal. The right ventricular  size is normal. There is mildly elevated pulmonary artery systolic  pressure. The estimated right ventricular systolic pressure is 95.2 mmHg.  3. The mitral valve is normal in structure. No evidence of mitral valve  regurgitation. No evidence of mitral stenosis.  4. The aortic valve is tricuspid. Aortic valve regurgitation is mild to  moderate. No valvular aortic stenosis is  present (valve opens well) but  there is turbulence across the LV outflow tract. I cannot definitively  visualize it, but suspect that there  is a subaortic membrane present creating severe subaortic stenosis. Aortic  valve mean gradient measures 43.0 mmHg. With pulmonary hypertension  present, would consider surgical evaluation.  5. The inferior vena cava is normal in size with greater than 50%  respiratory variability, suggesting right atrial pressure of 3 mmHg.   Comparison(s): TEE 03/27/16 Mild-moderate AI. Severely elevated AV  gradients with no AS and no subvalvular membrane. TTE 03/05/16 60-65%. PA  pressure 93mmHg. Moderate AI.    Assessment & Plan   1.  Subaortic  membranes- no increased DOE or activity intolerance.  Patient states she feels great.  Discussed recommendation for TEE to further evaluate subaortic membranes and LVOT gradient.  Her and her daughter expressed understanding. Order TEE  Shared Decision Making/Informed Consent The risks [esophageal damage, perforation (1:10,000 risk), bleeding, pharyngeal hematoma as well as other potential complications associated with conscious sedation including aspiration, arrhythmia, respiratory failure and death], benefits (treatment guidance and diagnostic support) and alternatives of a transesophageal echocardiogram were discussed in detail with Ms. Bettes and she is willing to proceed.    Paroxysmal atrial fibrillation- heart rate today 80.  Cardiac unaware.  CHA2DS2-VASc score 2 (gender, HTN) on Eliquis and diltiazem denies bleeding issues. Continue diltiazem, apixaban Heart healthy low-sodium diet-salty 6 given Increase physical activity as tolerated    Disposition: Follow-up with Dr. Oval Linsey after TEE   Jossie Ng. Neva Ramaswamy NP-C    06/01/2020, 10:07 AM [place] [address] Office [phone] Fax [phone]  Notice: This dictation was prepared with Dragon dictation along with smaller phrase technology. Any transcriptional errors that result from this process are unintentional and may not be corrected upon review.  I spent 15 minutes examining this patient, reviewing medications, and using patient centered shared decision making involving her cardiac care.  Prior to her visit I spent greater than 20 minutes reviewing her past medical history,  medications, and prior cardiac tests.

## 2020-05-31 NOTE — H&P (View-Only) (Signed)
 Cardiology Clinic Note   Patient Name: Whitney Juarez Date of Encounter: 06/01/2020  Primary Care Provider:  Patient, No Pcp Per (Inactive) Primary Cardiologist:  Tiffany Lisman, MD  Patient Profile    Whitney Juarez 55-year-old female presents the clinic today for a follow-up evaluation of her subaortic stenosis, and paroxysmal atrial fibrillation.  Past Medical History    Past Medical History:  Diagnosis Date  . Aortic regurgitation 04/20/2016  . Atrial fibrillation (HCC) 05/27/2015  . Hypertension 05/27/2015  . Severe aortic stenosis 03/12/2016  . 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 CESAREAN SECTION    . TEE WITHOUT CARDIOVERSION N/A 03/27/2016   Procedure: TRANSESOPHAGEAL ECHOCARDIOGRAM (TEE);  Surgeon: Tiffany Iroquois, MD;  Location: [facility];  Service: Cardiovascular;  Laterality: N/A;    Allergies  No Known Allergies  History of Present Illness    Ms. Juarez is a PMH of paroxysmal atrial fibrillation CHA2DS2-VASc 2, HTN, subaortic stenosis, and moderate aortic insufficiency.  She was seen on 9/21.  She requested a transfer back to Dr. [place].  Her echocardiogram showed evidence of increased LVOT velocities which were concerning for worsening subaortic stenosis.  Dr. [place] contacted patient for follow-up visit and to review results.  She was scheduled for visit 12/02/2020 and no showed.  She was seen by Dr.Chandrasekhar on 04/27/2020.  During that time she reported she was doing well.  She reported that she was not sure why her echocardiogram results meant.  She had missed her appointment because her daughter could not get off work.  She denied chest pain pressure.  She denied shortness of breath and dyspnea on exertion.  She denied orthopnea PND and lower extremity swelling.  She denied palpitations and syncope.  She reported compliance with Eliquis and denied bleeding issues.  A TEE was  recommended to better assess her AI.  She requested that she see Dr. [place] to discuss subaortic membranes, AI and worsening LVOT gradient.    She presents the clinic today for follow-up evaluation states she expected to see Dr. Vivian.  We reviewed her previous echocardiogram and recommendations for repeating esophageal echocardiogram.  She was accompanied by interpreter and her daughter.  She expressed understanding.  She reports that she only notices fast or irregular heartbeats with bad news.  Her blood pressure initially is 170 over 74 and on recheck her blood pressure is 138/56.  On review of her previous blood pressure never fairly well controlled in the 120s systolic.  I will have her maintain a blood pressure log, order TEE, give the salty 6 diet sheet information, and have her increase her physical activity as tolerated.  We will have her follow-up after TEE with Dr. .  Today she denies chest pain, shortness of breath, lower extremity edema, fatigue, palpitations, melena, hematuria, hemoptysis, diaphoresis, weakness, presyncope, syncope, orthopnea, and PND.   Home Medications    Prior to Admission medications   Medication Sig Start Date End Date Taking? Authorizing Provider  apixaban (ELIQUIS) 5 MG TABS tablet Take 1 tablet (5 mg total) by mouth 2 (two) times daily. 10/16/19   Chandrasekhar, Mahesh A, MD  diltiazem (CARDIZEM CD) 240 MG 24 hr capsule TAKE 1 CAPSULE BY MOUTH EVERY DAY 10/16/19   Chandrasekhar, Mahesh A, MD  ibuprofen (ADVIL,MOTRIN) 600 MG tablet Take 1 tablet (600 mg total) by mouth every 6 (six) hours as needed. 06/29/16   Yao, David Hsienta, MD    Family History      Family History  Problem Relation Age of Onset  . Heart disease Brother    She indicated that her mother is alive. She indicated that her father is alive. She indicated that her sister is alive. She indicated that all of her three brothers are alive.  Social History    Social History    Socioeconomic History  . Marital status: Married    Spouse name: Not on file  . Number of children: Not on file  . Years of education: Not on file  . Highest education level: Not on file  Occupational History  . Not on file  Tobacco Use  . Smoking status: Never Smoker  . Smokeless tobacco: Never Used  Substance and Sexual Activity  . Alcohol use: No  . Drug use: No  . Sexual activity: Not on file  Other Topics Concern  . Not on file  Social History Narrative  . Not on file   Social Determinants of Health   Financial Resource Strain: Not on file  Food Insecurity: Not on file  Transportation Needs: Not on file  Physical Activity: Not on file  Stress: Not on file  Social Connections: Not on file  Intimate Partner Violence: Not on file     Review of Systems    General:  No chills, fever, night sweats or weight changes.  Cardiovascular:  No chest pain, dyspnea on exertion, edema, orthopnea, palpitations, paroxysmal nocturnal dyspnea. Dermatological: No rash, lesions/masses Respiratory: No cough, dyspnea Urologic: No hematuria, dysuria Abdominal:   No nausea, vomiting, diarrhea, bright red blood per rectum, melena, or hematemesis Neurologic:  No visual changes, wkns, changes in mental status. All other systems reviewed and are otherwise negative except as noted above.  Physical Exam    VS:  BP (!) 138/56 (BP Location: Right Arm, Patient Position: Sitting, Cuff Size: Normal)   Pulse 80   Ht 4' 11" (1.499 m)   Wt 165 lb (74.8 kg)   BMI 33.33 kg/m  , BMI Body mass index is 33.33 kg/m. GEN: Well nourished, well developed, in no acute distress. HEENT: normal. Neck: Supple, no JVD, carotid bruits, or masses. Cardiac: RRR, no murmurs, rubs, or gallops. No clubbing, cyanosis, edema.  Radials/DP/PT 2+ and equal bilaterally.  Respiratory:  Respirations regular and unlabored, clear to auscultation bilaterally. GI: Soft, nontender, nondistended, BS + x 4. MS: no deformity  or atrophy. Skin: warm and dry, no rash. Neuro:  Strength and sensation are intact. Psych: Normal affect.  Accessory Clinical Findings    Recent Labs: 10/07/2019: BUN 10; Creatinine, Ser 0.68; Hemoglobin 13.8; Platelets 255; Potassium 3.6; Sodium 142   Recent Lipid Panel No results found for: CHOL, TRIG, HDL, CHOLHDL, VLDL, LDLCALC, LDLDIRECT  ECG personally reviewed by me today-none today.  Echocardiogram 11/23/2019 IMPRESSIONS    1. Left ventricular ejection fraction, by estimation, is 60 to 65%. The  left ventricle has normal function. The left ventricle has no regional  wall motion abnormalities. There is mild left ventricular hypertrophy.  Left ventricular diastolic parameters  are consistent with Grade II diastolic dysfunction (pseudonormalization).  2. Right ventricular systolic function is normal. The right ventricular  size is normal. There is mildly elevated pulmonary artery systolic  pressure. The estimated right ventricular systolic pressure is 41.2 mmHg.  3. The mitral valve is normal in structure. No evidence of mitral valve  regurgitation. No evidence of mitral stenosis.  4. The aortic valve is tricuspid. Aortic valve regurgitation is mild to  moderate. No valvular aortic stenosis is   present (valve opens well) but  there is turbulence across the LV outflow tract. I cannot definitively  visualize it, but suspect that there  is a subaortic membrane present creating severe subaortic stenosis. Aortic  valve mean gradient measures 43.0 mmHg. With pulmonary hypertension  present, would consider surgical evaluation.  5. The inferior vena cava is normal in size with greater than 50%  respiratory variability, suggesting right atrial pressure of 3 mmHg.   Comparison(s): TEE 03/27/16 Mild-moderate AI. Severely elevated AV  gradients with no AS and no subvalvular membrane. TTE 03/05/16 60-65%. PA  pressure 93mmHg. Moderate AI.    Assessment & Plan   1.  Subaortic  membranes- no increased DOE or activity intolerance.  Patient states she feels great.  Discussed recommendation for TEE to further evaluate subaortic membranes and LVOT gradient.  Her and her daughter expressed understanding. Order TEE  Shared Decision Making/Informed Consent The risks [esophageal damage, perforation (1:10,000 risk), bleeding, pharyngeal hematoma as well as other potential complications associated with conscious sedation including aspiration, arrhythmia, respiratory failure and death], benefits (treatment guidance and diagnostic support) and alternatives of a transesophageal echocardiogram were discussed in detail with Ms. Bettes and she is willing to proceed.    Paroxysmal atrial fibrillation- heart rate today 80.  Cardiac unaware.  CHA2DS2-VASc score 2 (gender, HTN) on Eliquis and diltiazem denies bleeding issues. Continue diltiazem, apixaban Heart healthy low-sodium diet-salty 6 given Increase physical activity as tolerated    Disposition: Follow-up with Dr. Oval Linsey after TEE   Jossie Ng. Virgina Deakins NP-C    06/01/2020, 10:07 AM [place] [address] Office [phone] Fax [phone]  Notice: This dictation was prepared with Dragon dictation along with smaller phrase technology. Any transcriptional errors that result from this process are unintentional and may not be corrected upon review.  I spent 15 minutes examining this patient, reviewing medications, and using patient centered shared decision making involving her cardiac care.  Prior to her visit I spent greater than 20 minutes reviewing her past medical history,  medications, and prior cardiac tests.

## 2020-06-01 ENCOUNTER — Encounter: Payer: Self-pay | Admitting: General Practice

## 2020-06-01 ENCOUNTER — Other Ambulatory Visit: Payer: Self-pay

## 2020-06-01 ENCOUNTER — Ambulatory Visit (INDEPENDENT_AMBULATORY_CARE_PROVIDER_SITE_OTHER): Payer: BC Managed Care – PPO | Admitting: General Practice

## 2020-06-01 VITALS — BP 138/56 | HR 80 | Ht 59.0 in | Wt 165.0 lb

## 2020-06-01 DIAGNOSIS — I480 Paroxysmal atrial fibrillation: Secondary | ICD-10-CM

## 2020-06-01 DIAGNOSIS — Z79899 Other long term (current) drug therapy: Secondary | ICD-10-CM | POA: Diagnosis not present

## 2020-06-01 DIAGNOSIS — Q244 Congenital subaortic stenosis: Secondary | ICD-10-CM

## 2020-06-01 NOTE — Patient Instructions (Signed)
Follow-Up: Your next appointment:   07-08-2020 at 11:45AM In Person with Coletta Memos, FNP   At [facility], you and your health needs are our priority.  As part of our continuing mission to provide you with exceptional heart care, we have created designated Provider Care Teams.  These Care Teams include your primary Cardiologist (physician) and Advanced Practice Providers (APPs -  Physician Assistants and Nurse Practitioners) who all work together to provide you with the care you need, when you need it.  We recommend signing up for the patient portal called "MyChart".  Sign up information is provided on this After Visit Summary.  MyChart is used to connect with patients for Virtual Visits (Telemedicine).  Patients are able to view lab/test results, encounter notes, upcoming appointments, etc.  Non-urgent messages can be sent to your provider as well.   To learn more about what you can do with MyChart, go to [email or web address].     Dear Ms Caroll,  You are scheduled for a TEE on 06-10-2020 with Dr. Harrington Challenger.  Please arrive at the [facility] (Main Entrance A) at [facility]: [address] [place], [place] [address] at 10 am. (1 hour prior to procedure unless lab work is needed; if lab work is needed arrive 1.5 hours ahead)  DIET: Nothing to eat or drink after midnight except a sip of water with medications (see medication instructions below)  Medication Instructions:    Continue your anticoagulant: APIXABAN (ELIQUIS).    You will need to continue your anticoagulant after your procedure until you are told by your Provider that it is safe to stop.   You will need a COVID-19  test prior to your procedure. You are scheduled for 06-07-2020 at 11:50 AM.   This is a Drive Up Visit at [address] [place], [place] [address]. Someone will direct you to the appropriate testing line. Stay in your car and someone will be with you shortly.  Labs:   Come to: HERE TO OUR OFFICE  Tuesday 06-07-2020 BEFORE YOU COVID TESTING. LABS ARE NON-FASTING.  You must have a responsible person to drive you home and stay in the waiting area during your procedure. Failure to do so could result in cancellation.  Bring your insurance cards.  *Special Note: Every effort is made to have your procedure done on time. Occasionally there are emergencies that occur at the hospital that may cause delays. Please be patient if a delay does occur.     Estimada seora [place],  Est programado para un TEE el 06-10-2020 con el Dr. Harrington Challenger. [place] [facility] [place] (Entrada principal A) en [place]: [address] [place], [place] [address] a las 10 am. (1 hora antes del procedimiento a menos que se necesiten anlisis de laboratorio; si se necesitan anlisis de laboratorio, llegue 1,5 horas antes)  DIETA: nada para comer o beber despus de la medianoche, excepto un sorbo de agua con medicamentos (consulte las instrucciones de medicamentos a continuacin)  Instrucciones de medicamentos:  Contine con su anticoagulante: APIXABAN (ELIQUIS).  Deber continuar con su anticoagulante despus de su procedimiento hasta que su proveedor le indique que es seguro detenerlo.  Necesitar una prueba de COVID-19 antes de su procedimiento. Est programado para el 04-09-2020 a las 11:50 a. m.  Esta es una visita desde el automvil en [place]. [place], [place] [address]. Alguien lo dirigir a la lnea de [place]. Foy [place] en su automvil y alguien estar con usted en  breve.  laboratorios:  Ven a: AQU A NUESTRA OFICINA [place] [birth year] ANTES DE HACERTE LA [place]. LOS LABORATORIOS NO SON EN [place].  Debe tener una persona responsable que lo lleve a su casa y Nature conservation officer en la sala de espera durante su procedimiento. El no hacerlo TEFL teacher.  Traiga sus tarjetas de seguro.  *Nota especial: Se hace todo lo posible para que su procedimiento se realice a  tiempo. De vez en cuando hay emergencias que ocurren en el hospital que pueden causar demoras. Tenga paciencia si se produce un retraso.

## 2020-06-07 ENCOUNTER — Other Ambulatory Visit (HOSPITAL_COMMUNITY)
Admission: RE | Admit: 2020-06-07 | Discharge: 2020-06-07 | Disposition: A | Discharge status: Home / Self Care | Payer: BC Managed Care – PPO | Source: Ambulatory Visit | Attending: Internal Medicine | Admitting: Internal Medicine

## 2020-06-07 DIAGNOSIS — Z20822 Contact with and (suspected) exposure to covid-19: Secondary | ICD-10-CM | POA: Insufficient documentation

## 2020-06-07 DIAGNOSIS — Z7901 Long term (current) use of anticoagulants: Secondary | ICD-10-CM | POA: Diagnosis not present

## 2020-06-07 DIAGNOSIS — Z01812 Encounter for preprocedural laboratory examination: Secondary | ICD-10-CM | POA: Insufficient documentation

## 2020-06-07 DIAGNOSIS — I10 Essential (primary) hypertension: Secondary | ICD-10-CM | POA: Diagnosis not present

## 2020-06-07 DIAGNOSIS — I351 Nonrheumatic aortic (valve) insufficiency: Secondary | ICD-10-CM | POA: Diagnosis not present

## 2020-06-07 DIAGNOSIS — Z79899 Other long term (current) drug therapy: Secondary | ICD-10-CM | POA: Diagnosis not present

## 2020-06-07 DIAGNOSIS — Z8249 Family history of ischemic heart disease and other diseases of the circulatory system: Secondary | ICD-10-CM | POA: Diagnosis not present

## 2020-06-07 DIAGNOSIS — Q244 Congenital subaortic stenosis: Secondary | ICD-10-CM | POA: Diagnosis not present

## 2020-06-07 DIAGNOSIS — I480 Paroxysmal atrial fibrillation: Secondary | ICD-10-CM | POA: Diagnosis not present

## 2020-06-07 LAB — CBC
Hematocrit: 42.1 % (ref 34.0–46.6)
Hemoglobin: 13.8 g/dL (ref 11.1–15.9)
MCH: 27.7 pg (ref 26.6–33.0)
MCHC: 32.8 g/dL (ref 31.5–35.7)
MCV: 85 fL (ref 79–97)
Platelets: 276 10*3/uL (ref 150–450)
RBC: 4.98 x10E6/uL (ref 3.77–5.28)
RDW: 13.2 % (ref 11.7–15.4)
WBC: 8.3 10*3/uL (ref 3.4–10.8)

## 2020-06-07 LAB — BASIC METABOLIC PANEL
BUN/Creatinine Ratio: 16 (ref 9–23)
BUN: 9 mg/dL (ref 6–24)
CO2: 23 mmol/L (ref 20–29)
Calcium: 9.1 mg/dL (ref 8.7–10.2)
Chloride: 100 mmol/L (ref 96–106)
Creatinine, Ser: 0.58 mg/dL (ref 0.57–1.00)
Glucose: 91 mg/dL (ref 65–99)
Potassium: 3.9 mmol/L (ref 3.5–5.2)
Sodium: 139 mmol/L (ref 134–144)
eGFR: 108 mL/min/{1.73_m2} (ref >59)

## 2020-06-07 NOTE — Addendum Note (Signed)
Addended by: Waylan Rocher on: 06/07/2020 09:41 AM   Modules accepted: Orders

## 2020-06-08 LAB — SARS CORONAVIRUS 2 (TAT 6-24 HRS): SARS Coronavirus 2: NEGATIVE

## 2020-06-10 ENCOUNTER — Ambulatory Visit (HOSPITAL_COMMUNITY): Payer: BC Managed Care – PPO | Admitting: Certified Registered Nurse Anesthetist

## 2020-06-10 ENCOUNTER — Other Ambulatory Visit: Payer: Self-pay

## 2020-06-10 ENCOUNTER — Encounter (HOSPITAL_COMMUNITY)
Admission: RE | Disposition: A | Discharge status: Home / Self Care | Payer: Self-pay | Source: Home / Self Care | Attending: Internal Medicine

## 2020-06-10 ENCOUNTER — Encounter (HOSPITAL_COMMUNITY): Payer: Self-pay | Admitting: Internal Medicine

## 2020-06-10 ENCOUNTER — Ambulatory Visit (HOSPITAL_COMMUNITY)
Admission: RE | Admit: 2020-06-10 | Discharge: 2020-06-10 | Disposition: A | Discharge status: Home / Self Care | Payer: BC Managed Care – PPO | Attending: Internal Medicine | Admitting: Internal Medicine

## 2020-06-10 ENCOUNTER — Ambulatory Visit (HOSPITAL_BASED_OUTPATIENT_CLINIC_OR_DEPARTMENT_OTHER): Payer: BC Managed Care – PPO

## 2020-06-10 DIAGNOSIS — Z20822 Contact with and (suspected) exposure to covid-19: Secondary | ICD-10-CM | POA: Diagnosis not present

## 2020-06-10 DIAGNOSIS — I351 Nonrheumatic aortic (valve) insufficiency: Secondary | ICD-10-CM

## 2020-06-10 DIAGNOSIS — I10 Essential (primary) hypertension: Secondary | ICD-10-CM | POA: Insufficient documentation

## 2020-06-10 DIAGNOSIS — Z79899 Other long term (current) drug therapy: Secondary | ICD-10-CM | POA: Insufficient documentation

## 2020-06-10 DIAGNOSIS — Z7901 Long term (current) use of anticoagulants: Secondary | ICD-10-CM | POA: Diagnosis not present

## 2020-06-10 DIAGNOSIS — Z8249 Family history of ischemic heart disease and other diseases of the circulatory system: Secondary | ICD-10-CM | POA: Diagnosis not present

## 2020-06-10 DIAGNOSIS — I480 Paroxysmal atrial fibrillation: Secondary | ICD-10-CM | POA: Insufficient documentation

## 2020-06-10 DIAGNOSIS — I083 Combined rheumatic disorders of mitral, aortic and tricuspid valves: Secondary | ICD-10-CM | POA: Diagnosis not present

## 2020-06-10 DIAGNOSIS — Q244 Congenital subaortic stenosis: Secondary | ICD-10-CM

## 2020-06-10 DIAGNOSIS — I4891 Unspecified atrial fibrillation: Secondary | ICD-10-CM | POA: Diagnosis not present

## 2020-06-10 HISTORY — PX: TEE WITHOUT CARDIOVERSION: SHX5443

## 2020-06-10 HISTORY — PX: BUBBLE STUDY: SHX6837

## 2020-06-10 SURGERY — ECHOCARDIOGRAM, TRANSESOPHAGEAL
Anesthesia: Monitor Anesthesia Care

## 2020-06-10 MED ORDER — LIDOCAINE 2% (20 MG/ML) 5 ML SYRINGE
INTRAMUSCULAR | Status: DC | PRN
  Administered 2020-06-10: 80 mg via INTRAVENOUS

## 2020-06-10 MED ORDER — LACTATED RINGERS IV SOLN: INTRAVENOUS | Status: DC

## 2020-06-10 MED ORDER — SODIUM CHLORIDE 0.9 % IV SOLN: INTRAVENOUS | Status: DC

## 2020-06-10 MED ORDER — PHENYLEPHRINE HCL (PRESSORS) 10 MG/ML IV SOLN
INTRAVENOUS | Status: DC | PRN
  Administered 2020-06-10 (×2): 80 ug via INTRAVENOUS

## 2020-06-10 MED ORDER — DEXMEDETOMIDINE (PRECEDEX) IN NS 20 MCG/5ML (4 MCG/ML) IV SYRINGE
PREFILLED_SYRINGE | INTRAVENOUS | Status: DC | PRN
  Administered 2020-06-10: 8 ug via INTRAVENOUS
  Administered 2020-06-10: 4 ug via INTRAVENOUS
  Administered 2020-06-10: 8 ug via INTRAVENOUS

## 2020-06-10 MED ORDER — PROPOFOL 500 MG/50ML IV EMUL
INTRAVENOUS | Status: DC | PRN
  Administered 2020-06-10: 125 ug/kg/min via INTRAVENOUS

## 2020-06-10 MED ORDER — PROPOFOL 10 MG/ML IV BOLUS
INTRAVENOUS | Status: DC | PRN
  Administered 2020-06-10: 30 mg via INTRAVENOUS
  Administered 2020-06-10: 20 mg via INTRAVENOUS
  Administered 2020-06-10: 10 mg via INTRAVENOUS

## 2020-06-10 NOTE — Anesthesia Preprocedure Evaluation (Addendum)
Anesthesia Evaluation  Patient identified by MRN, date of birth, ID band  Reviewed: Allergy & Precautions, NPO status , Patient's Chart, lab work & pertinent test results  Airway Mallampati: II  TM Distance: >3 FB Neck ROM: Full    Dental  (+) Edentulous Upper, Edentulous Lower   Pulmonary neg pulmonary ROS,    Pulmonary exam normal breath sounds clear to auscultation       Cardiovascular hypertension, + dysrhythmias (on eliquis) Atrial Fibrillation + Valvular Problems/Murmurs (severe subaortic stenosis) AS  Rhythm:Regular Rate:Normal + Systolic murmurs 35/4562: 1. Left ventricular ejection fraction, by estimation, is 60 to 65%. The left ventricle has normal function. The left ventricle has no regional wall motion abnormalities. There is mild left ventricular hypertrophy. Left ventricular diastolic parameters are consistent with Grade II diastolic dysfunction (pseudonormalization). 2. Right ventricular systolic function is normal. The right ventricular size is normal. There is mildly elevated pulmonary artery systolic pressure. The estimated right ventricular systolic pressure is 56.3 mmHg. 3. The mitral valve is normal in structure. No evidence of mitral valve regurgitation. No evidence of mitral stenosis. 4. The aortic valve is tricuspid. Aortic valve regurgitation is mild to moderate. No valvular aortic stenosis is present (valve opens well) but there is turbulence across the LV outflow tract. I cannot definitively visualize it, but suspect that there is a subaortic membrane present creating severe subaortic stenosis. Aortic valve mean gradient measures 43.0 mmHg. With pulmonary hypertension present, would consider surgical evaluation. 5. The inferior vena cava is normal in size with greater than 50% respiratory variability, suggesting right atrial pressure of 3 mmHg.   Neuro/Psych negative neurological ROS  negative psych ROS    GI/Hepatic negative GI ROS, Neg liver ROS,   Endo/Other  negative endocrine ROS  Renal/GU negative Renal ROS     Musculoskeletal negative musculoskeletal ROS (+)   Abdominal   Peds  Hematology negative hematology ROS (+)   Anesthesia Other Findings   Reproductive/Obstetrics negative OB ROS                            Anesthesia Physical Anesthesia Plan  ASA: III  Anesthesia Plan: MAC   Post-op Pain Management:    Induction: Intravenous  PONV Risk Score and Plan: 2 and Propofol infusion, TIVA and Treatment may vary due to age or medical condition  Airway Management Planned: Natural Airway  Additional Equipment: None  Intra-op Plan:   Post-operative Plan:   Informed Consent: I have reviewed the patients History and Physical, chart, labs and discussed the procedure including the risks, benefits and alternatives for the proposed anesthesia with the patient or authorized representative who has indicated his/her understanding and acceptance.     Interpreter used for [employer] Discussed with: Anesthesiologist and CRNA  Anesthesia Plan Comments:        Anesthesia Quick Evaluation

## 2020-06-10 NOTE — Interval H&P Note (Signed)
History and Physical Interval Note:  06/10/2020 10:50 AM  Whitney Juarez  has presented today for surgery, with the diagnosis of [place].  The various methods of treatment have been discussed with the patient and family. After consideration of risks, benefits and other options for treatment, the patient has consented to  Procedure(s): TRANSESOPHAGEAL ECHOCARDIOGRAM (TEE) (N/A) as a surgical intervention.  The patient's history has been reviewed, patient examined, no change in status, stable for surgery.  I have reviewed the patient's chart and labs.  Questions were answered to the patient's satisfaction.     Dorris Carnes

## 2020-06-10 NOTE — Discharge Instructions (Signed)

## 2020-06-10 NOTE — CV Procedure (Signed)
TEE  Patient sedated by anesthesia with Propofol and Precedex intravenously Throad anesthetized  Bite guard placed TEE probe advanced to mid esophagus without difficulty   Full report to follow in CV section of chart  Procedure without complications  Dorris Carnes MD

## 2020-06-10 NOTE — Transfer of Care (Signed)
Immediate Anesthesia Transfer of Care Note  Patient: Whitney Juarez  Procedure(s) Performed: TRANSESOPHAGEAL ECHOCARDIOGRAM (TEE) (N/A ) BUBBLE STUDY  Patient Location: Endoscopy Unit  Anesthesia Type:MAC  Level of Consciousness: drowsy and responds to stimulation  Airway & Oxygen Therapy: Patient Spontanous Breathing and Patient connected to nasal cannula oxygen  Post-op Assessment: Report given to RN and Post -op Vital signs reviewed and stable  Post vital signs: Reviewed and stable  Last Vitals:  Vitals Value Taken Time  BP 94/51 06/10/20 1140  Temp    Pulse 81 06/10/20 1144  Resp 24 06/10/20 1144  SpO2 97 % 06/10/20 1144  Vitals shown include unvalidated device data.  Last Pain:  Vitals:   06/10/20 1140  TempSrc: Oral  PainSc:          Complications: No complications documented.

## 2020-06-10 NOTE — Anesthesia Postprocedure Evaluation (Signed)
Anesthesia Post Note  Patient: Whitney Juarez  Procedure(s) Performed: TRANSESOPHAGEAL ECHOCARDIOGRAM (TEE) (N/A ) BUBBLE STUDY     Patient location during evaluation: PACU Anesthesia Type: MAC Level of consciousness: awake and alert Pain management: pain level controlled Vital Signs Assessment: post-procedure vital signs reviewed and stable Respiratory status: spontaneous breathing and respiratory function stable Cardiovascular status: stable Postop Assessment: no apparent nausea or vomiting Anesthetic complications: no   No complications documented.  Last Vitals:  Vitals:   06/10/20 1150 06/10/20 1220  BP: (!) 109/59 138/64  Pulse: 87   Resp: (!) 21 20  Temp:    SpO2: 98%     Last Pain:  Vitals:   06/10/20 1150  TempSrc:   PainSc: 2                  Merlinda Frederick

## 2020-06-12 ENCOUNTER — Encounter (HOSPITAL_COMMUNITY): Payer: Self-pay | Admitting: Internal Medicine

## 2020-06-13 ENCOUNTER — Other Ambulatory Visit: Payer: Self-pay | Admitting: *Deleted

## 2020-06-13 MED ORDER — DILTIAZEM HCL ER COATED BEADS 240 MG PO CP24: ORAL_CAPSULE | ORAL | 1 refills | Status: DC

## 2020-06-15 ENCOUNTER — Telehealth: Payer: Self-pay | Admitting: *Deleted

## 2020-06-15 DIAGNOSIS — I351 Nonrheumatic aortic (valve) insufficiency: Secondary | ICD-10-CM

## 2020-06-15 NOTE — Telephone Encounter (Signed)
-----   Message from Skeet Latch, MD sent at 06/13/2020 12:08 PM EDT ----- She needs CT surgery referral.
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
## 2020-07-07 ENCOUNTER — Other Ambulatory Visit: Payer: Self-pay

## 2020-07-07 ENCOUNTER — Encounter: Payer: Self-pay | Admitting: Cardiothoracic Surgery

## 2020-07-07 ENCOUNTER — Institutional Professional Consult (permissible substitution) (INDEPENDENT_AMBULATORY_CARE_PROVIDER_SITE_OTHER): Payer: BC Managed Care – PPO | Admitting: Cardiothoracic Surgery

## 2020-07-07 VITALS — BP 175/91 | HR 84 | Resp 20 | Ht 59.0 in | Wt 164.0 lb

## 2020-07-07 DIAGNOSIS — I351 Nonrheumatic aortic (valve) insufficiency: Secondary | ICD-10-CM

## 2020-07-07 NOTE — Progress Notes (Signed)
[address]       [place] [address]             [place] REPORT  Referring Provider is Skeet Latch, MD Primary Cardiologist is Skeet Latch, MD PCP is Patient, No Pcp Per (Inactive)  Chief Complaint  Patient presents with  . Aortic Insuffiency    Surgical consult, TEE 06/10/20, No Cardiac Cath    HPI: 55 year old lady was referred for consideration of surgery for subaortic membrane.  Has been followed by [facility] cardi[facility].  She has been noted on serial echocardiograms to evaluate the worsening LV outflow tract gradient.  She also suffers from atrial fibrillation.  She takes Eliquis for this.  Her only medication is diltiazem.  She denies a history of stroke or heart failure symptoms.  She endorses mild chest discomfort with going up stairs or with heavy exertion.  Past Medical History:  Diagnosis Date  . Aortic regurgitation 04/20/2016  . Atrial fibrillation ([place]) 05/27/2015  . Hypertension 05/27/2015  . Severe aortic stenosis 03/12/2016  . 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 BUBBLE STUDY  06/10/2020   Procedure: BUBBLE STUDY;  Surgeon: Fay Records, MD;  Location: [place];  Service: Cardiovascular;;  . CESAREAN SECTION    . TEE WITHOUT CARDIOVERSION N/A 03/27/2016   Procedure: TRANSESOPHAGEAL ECHOCARDIOGRAM (TEE);  Surgeon: Skeet Latch, MD;  Location: [place];  Service: Cardiovascular;  Laterality: N/A;  . TEE WITHOUT CARDIOVERSION N/A 06/10/2020   Procedure: TRANSESOPHAGEAL ECHOCARDIOGRAM (TEE);  Surgeon: Fay Records, MD;  Location: [facility] ENDOSCOPY;  Service: Cardiovascular;  Laterality: N/A;    Family History  Problem Relation Age of Onset  . Heart disease Brother     Social History   Socioeconomic History  . Marital status: Married    Spouse name: Not on file  . Number of children: Not on file  . Years of education: Not on file   . Highest education level: Not on file  Occupational History  . Not on file  Tobacco Use  . Smoking status: Never Smoker  . Smokeless tobacco: Never Used  Substance and Sexual Activity  . Alcohol use: No  . Drug use: No  . Sexual activity: Not on file  Other Topics Concern  . Not on file  Social History Narrative  . Not on file   Social Determinants of Health   Financial Resource Strain: Not on file  Food Insecurity: Not on file  Transportation Needs: Not on file  Physical Activity: Not on file  Stress: Not on file  Social Connections: Not on file  Intimate Partner Violence: Not on file    Current Outpatient Medications  Medication Sig Dispense Refill  . apixaban (ELIQUIS) 5 MG TABS tablet Take 1 tablet (5 mg total) by mouth 2 (two) times daily. 60 tablet 5  . diltiazem (CARDIZEM CD) 240 MG 24 hr capsule TAKE 1 CAPSULE BY MOUTH EVERY DAY 90 capsule 1  . ibuprofen (ADVIL,MOTRIN) 600 MG tablet Take 1 tablet (600 mg total) by mouth every 6 (six) hours as needed. 30 tablet 0   No current facility-administered medications for this visit.    No Known Allergies    Review of Systems:   General:  No weight change and good energy level  Cardiac:  As per HPI  Respiratory:  Denies cough and sleep apnea  GI:   No abdominal  pain or bleeding  GU:   No kidney or bladder symptoms  Vascular:  Denies claudication symptoms or peripheral edema  Neuro:   Denies strokes or TIAs  Musculoskeletal: Negative for arthritis or myalgias  Skin:   No rashes or itching  Psych:   Anxiety or depression  Eyes:   Negative  ENT:   Negative  Hematologic:  Negative  Endocrine:  No diabetes or thyroid disease     Physical Exam:   BP (!) 175/91 (BP Location: Right Arm, Patient Position: Sitting)   Pulse 84   Resp 20   Ht 4\' 11"  (1.499 m)   Wt 74.4 kg   SpO2 96% Comment: RA  BMI 33.12 kg/m   General:   well-appearing  HEENT:  Unremarkable   Neck:   no JVD, no bruits, no adenopathy    Chest:   clear to auscultation, symmetrical breath sounds, no wheezes, no rhonchi   CV:   RRR, 3/6 systolic ejection murmur at the right sternal border; very soft diastolic murmur at the apex  Abdomen:  soft, non-tender, no masses   Extremities:  warm, well-perfused, pulses intact, no LE edema  Rectal/GU  Deferred  Neuro:   Grossly non-focal and symmetrical throughout  Skin:   Clean and dry, no rashes, no breakdown   Diagnostic Tests:  I have personally reviewed her echocardiogram from 06/10/2020 and agree with its interpretation  Impression:  55 year old lady who is relatively asymptomatic with a subaortic membrane and elevated LV outflow tract gradients.  There are no strong indications for surgery at present  Plan:  Follow-up with [facility] cardiology as scheduled Follow-up with thoracic surgery in 6 months with repeat echocardiogram Keep note of weight gain or subtle symptom development such as orthopnea, dyspnea on exertion, or peripheral edema   I spent in excess of 30 minutes during the conduct of this office consultation and >50% of this time involved direct face-to-face encounter with the patient for counseling and/or coordination of their care.          Level 3 Office Consult = 40 minutes         Level 4 Office Consult = 60 minutes         Level 5 Office Consult = 80 minutes  B.  Murvin Natal, MD 07/07/2020 11:41 AM

## 2020-07-08 ENCOUNTER — Ambulatory Visit: Payer: BC Managed Care – PPO | Admitting: General Practice

## 2020-07-08 ENCOUNTER — Encounter: Payer: Self-pay | Admitting: General Practice

## 2020-07-08 ENCOUNTER — Ambulatory Visit (INDEPENDENT_AMBULATORY_CARE_PROVIDER_SITE_OTHER): Payer: BC Managed Care – PPO | Admitting: General Practice

## 2020-07-08 VITALS — BP 152/76 | HR 78 | Ht 59.0 in | Wt 165.0 lb

## 2020-07-08 DIAGNOSIS — Q244 Congenital subaortic stenosis: Secondary | ICD-10-CM | POA: Diagnosis not present

## 2020-07-08 DIAGNOSIS — I10 Essential (primary) hypertension: Secondary | ICD-10-CM | POA: Diagnosis not present

## 2020-07-08 DIAGNOSIS — I480 Paroxysmal atrial fibrillation: Secondary | ICD-10-CM

## 2020-07-08 NOTE — Patient Instructions (Addendum)
Medication Instructions:  The current medical regimen is effective;  continue present plan and medications as directed. Please refer to the Current Medication list given to you today.  *If you need a refill on your cardiac medications before your next appointment, please call your pharmacy*  Lab Work:   Testing/Procedures:  NONE    NONE  Special Instructions TAKE AND LOG YOU BLOOD PRESSURE-TOME Y REGISTRE SU PRESIN ARTERIALPLEASE CALL IF BP >140   OR ANY SYMPTOMS:SHORTNESS OF BREATH or WHEN LYING DOWN, LEG SWELLING OR CHEST PAIN or PRESSURE- O CUALQUIER SNTOMA: FALTA DE AIRE o AL ACOSTARSE, HINCHAZN DE LAS PIERNAS O DOLOR EN EL PECHO O PRESIN  PLEASE READ AND FOLLOW SALTY 6-ATTACHED-1,800mg  daily-POR FAVOR LEA Y SIGA SALTY 6-ADJUNTO-1,800 mg diarios  PLEASE INCREASE PHYSICAL ACTIVITY AS TOLERATED-POR FAVOR AUMENTAR LA ACTIVIDAD FSICA SEGN [place]  Follow-Up: Your next appointment:  4 month(s) In Person with Skeet Latch, MD ONLY At [facility], you and your health needs are our priority.  As part of our continuing mission to provide you with exceptional heart care, we have created designated Provider Care Teams.  These Care Teams include your primary Cardiologist (physician) and Advanced Practice Providers (APPs -  Physician Assistants and Nurse Practitioners) who all work together to provide you with the care you need, when you need it.-En [facility], usted y sus necesidades de salud son [place] prioridad. Como parte de nuestra misin continua de brindarle una atencin cardaca excepcional, hemos creado equipos de atencin de proveedores designados. Estos equipos de atencin incluyen a su cardilogo primario (mdico) y proveedores de Financial planner (APP, asistentes mdicos y enfermeras practicantes) que trabajan juntos para brindarle la atencin que necesita, cuando la necesita.  We recommend signing up for the patient portal called "MyChart".  Sign up information is  provided on this After Visit Summary.  MyChart is used to connect with patients for Virtual Visits (Telemedicine).  Patients are able to view lab/test results, encounter notes, upcoming appointments, etc.  Non-urgent messages can be sent to your provider as well.   To learn more about what you can do with MyChart, go to [email or web address] registrarse en el portal del paciente llamado "MyChart". La informacin de registro se proporciona en este Resumen posterior a la visita. MyChart se [place] para conectarse con pacientes para visitas virtuales (telemedicina). Los [employer] de laboratorio/pruebas, notas de encuentros, prximas citas, etc. Tambin se pueden enviar mensajes no urgentes a su proveedor. Para obtener ms informacin sobre lo que Designer, multimedia, vaya a [email or web address].[email or web address].            6 SALTY THINGS TO AVOID     1,800MG  DAILY

## 2020-07-11 NOTE — Telephone Encounter (Signed)
Patient was seen by Dr Orvan Seen 6/2

## 2020-08-06 ENCOUNTER — Other Ambulatory Visit: Payer: Self-pay

## 2020-08-06 ENCOUNTER — Emergency Department (HOSPITAL_BASED_OUTPATIENT_CLINIC_OR_DEPARTMENT_OTHER)
Admission: EM | Admit: 2020-08-06 | Discharge: 2020-08-07 | Disposition: A | Discharge status: Home / Self Care | Payer: BC Managed Care – PPO | Attending: Emergency Medicine | Admitting: Emergency Medicine

## 2020-08-06 DIAGNOSIS — E876 Hypokalemia: Secondary | ICD-10-CM

## 2020-08-06 DIAGNOSIS — I480 Paroxysmal atrial fibrillation: Secondary | ICD-10-CM | POA: Insufficient documentation

## 2020-08-06 DIAGNOSIS — Z7901 Long term (current) use of anticoagulants: Secondary | ICD-10-CM | POA: Insufficient documentation

## 2020-08-06 DIAGNOSIS — I10 Essential (primary) hypertension: Secondary | ICD-10-CM | POA: Insufficient documentation

## 2020-08-06 MED ORDER — DILTIAZEM LOAD VIA INFUSION
20.0000 mg | Freq: Once | INTRAVENOUS | Status: AC
  Administered 2020-08-07: 20 mg via INTRAVENOUS
  Filled 2020-08-06: qty 20

## 2020-08-06 MED ORDER — DILTIAZEM HCL-DEXTROSE 125-5 MG/125ML-% IV SOLN (PREMIX)
5.0000 mg/h | INTRAVENOUS | Status: DC
  Administered 2020-08-07: 5 mg/h via INTRAVENOUS
  Filled 2020-08-06: qty 125

## 2020-08-06 NOTE — ED Triage Notes (Signed)
Chest pain starting app 20-25 mins prior to arrival.

## 2020-08-07 ENCOUNTER — Other Ambulatory Visit: Payer: Self-pay

## 2020-08-07 LAB — CBC WITH DIFFERENTIAL/PLATELET
Abs Immature Granulocytes: 0.03 10*3/uL (ref 0.00–0.07)
Basophils Absolute: 0.1 10*3/uL (ref 0.0–0.1)
Basophils Relative: 1 %
Eosinophils Absolute: 0.3 10*3/uL (ref 0.0–0.5)
Eosinophils Relative: 2 %
HCT: 39.9 % (ref 36.0–46.0)
Hemoglobin: 13.4 g/dL (ref 12.0–15.0)
Immature Granulocytes: 0 %
Lymphocytes Relative: 39 %
Lymphs Abs: 4.8 10*3/uL — ABNORMAL HIGH (ref 0.7–4.0)
MCH: 28.2 pg (ref 26.0–34.0)
MCHC: 33.6 g/dL (ref 30.0–36.0)
MCV: 84 fL (ref 80.0–100.0)
Monocytes Absolute: 1.1 10*3/uL — ABNORMAL HIGH (ref 0.1–1.0)
Monocytes Relative: 9 %
Neutro Abs: 6.1 10*3/uL (ref 1.7–7.7)
Neutrophils Relative %: 49 %
Platelets: 283 10*3/uL (ref 150–400)
RBC: 4.75 MIL/uL (ref 3.87–5.11)
RDW: 13.2 % (ref 11.5–15.5)
WBC: 12.3 10*3/uL — ABNORMAL HIGH (ref 4.0–10.5)
nRBC: 0 % (ref 0.0–0.2)

## 2020-08-07 LAB — TROPONIN I (HIGH SENSITIVITY)
Troponin I (High Sensitivity): 10 ng/L (ref <18)
Troponin I (High Sensitivity): 115 ng/L (ref <18)

## 2020-08-07 LAB — BASIC METABOLIC PANEL
Anion gap: 10 (ref 5–15)
BUN: 17 mg/dL (ref 6–20)
CO2: 25 mmol/L (ref 22–32)
Calcium: 8.8 mg/dL — ABNORMAL LOW (ref 8.9–10.3)
Chloride: 101 mmol/L (ref 98–111)
Creatinine, Ser: 0.75 mg/dL (ref 0.44–1.00)
GFR, Estimated: >60 mL/min (ref >60)
Glucose, Bld: 153 mg/dL — ABNORMAL HIGH (ref 70–99)
Potassium: 3.1 mmol/L — ABNORMAL LOW (ref 3.5–5.1)
Sodium: 136 mmol/L (ref 135–145)

## 2020-08-07 MED ORDER — POTASSIUM CHLORIDE CRYS ER 20 MEQ PO TBCR: 20.0000 meq | EXTENDED_RELEASE_TABLET | Freq: Every day | ORAL | 0 refills | Status: DC

## 2020-08-07 MED ORDER — ETOMIDATE 2 MG/ML IV SOLN
0.1500 mg/kg | Freq: Once | INTRAVENOUS | Status: AC
  Administered 2020-08-07: 11.22 mg via INTRAVENOUS
  Filled 2020-08-07: qty 10

## 2020-08-07 MED ORDER — POTASSIUM CHLORIDE CRYS ER 20 MEQ PO TBCR
40.0000 meq | EXTENDED_RELEASE_TABLET | Freq: Once | ORAL | Status: AC
  Administered 2020-08-07: 40 meq via ORAL
  Filled 2020-08-07: qty 2

## 2020-08-07 NOTE — Sedation Documentation (Signed)
Family updated as to patient's status.

## 2020-08-07 NOTE — ED Notes (Signed)
Dr Florina Ou aware of elevated troponin. States it's explainable to due to afib with RVR.  Patient ok to discharge.

## 2020-08-07 NOTE — Sedation Documentation (Signed)
ED Provider at bedside. 

## 2020-08-07 NOTE — ED Provider Notes (Signed)
[place] DEPT [facility] Provider Note: Georgena Spurling, MD, FACEP  CSN: [id number] MRN: [id number] ARRIVAL: 08/06/20 at 2342 ROOM: [place]  Chest Pain   HISTORY OF PRESENT ILLNESS  08/07/20 12:10 AM Whitney Juarez is a 55 y.o. female with a history of atrial fibrillation.  She had been doing well until about 25 minutes prior to arrival when she felt her heart suddenly start beating rapidly.  It is been accompanied by pain that is more in her back than her chest.  She rates this as an 8 out of 10.  Nothing makes it better or worse.  She is also short of breath but not severely.  On arrival her heart rate was noted to be 179 and EKG showed A. fib with RVR.  She has had no nausea or vomiting with this.   Past Medical History:  Diagnosis Date   Aortic regurgitation 04/20/2016   Atrial fibrillation ([place]) 05/27/2015   Hypertension 05/27/2015   Severe aortic stenosis 03/12/2016   Subaortic stenosis 05/26/2016    Past Surgical History:  Procedure Laterality Date   BUBBLE STUDY  06/10/2020   Procedure: BUBBLE STUDY;  Surgeon: Fay Records, MD;  Location: [place];  Service: Cardiovascular;;   CESAREAN SECTION     TEE WITHOUT CARDIOVERSION N/A 03/27/2016   Procedure: TRANSESOPHAGEAL ECHOCARDIOGRAM (TEE);  Surgeon: Skeet Latch, MD;  Location: [place];  Service: Cardiovascular;  Laterality: N/A;   TEE WITHOUT CARDIOVERSION N/A 06/10/2020   Procedure: TRANSESOPHAGEAL ECHOCARDIOGRAM (TEE);  Surgeon: Fay Records, MD;  Location: [facility] ENDOSCOPY;  Service: Cardiovascular;  Laterality: N/A;    Family History  Problem Relation Age of Onset   Heart disease Brother     Social History   Tobacco Use   Smoking status: Never   Smokeless tobacco: Never  Substance Use Topics   Alcohol use: No   Drug use: No    Prior to Admission medications   Medication Sig Start Date End Date Taking? Authorizing Provider  apixaban (ELIQUIS) 5 MG TABS tablet Take 1 tablet (5  mg total) by mouth 2 (two) times daily. 10/16/19   Chandrasekhar, Mahesh A, MD  diltiazem (CARDIZEM CD) 240 MG 24 hr capsule TAKE 1 CAPSULE BY MOUTH EVERY DAY 06/13/20   Chandrasekhar, Mahesh A, MD  ibuprofen (ADVIL,MOTRIN) 600 MG tablet Take 1 tablet (600 mg total) by mouth every 6 (six) hours as needed. 06/29/16   Drenda Freeze, MD    Allergies Patient has no allergy information on record.   REVIEW OF SYSTEMS  Negative except as noted here or in the History of Present Illness.   PHYSICAL EXAMINATION  Initial Vital Signs Blood pressure (!) 138/102, pulse (!) 166, temperature 98 F (36.7 C), temperature source Oral, resp. rate (!) 28, height 4\' 9"  (1.448 m), weight 74.8 kg, SpO2 96 %.  Examination General: Well-developed, well-nourished female in no acute distress; appearance consistent with age of record HENT: normocephalic; atraumatic Eyes: pupils equal, round and reactive to light; extraocular muscles intact Neck: supple Heart: Irregular rhythm; tachycardia; systolic murmur loudest at the right upper sternal border Lungs: clear to auscultation bilaterally; tachypnea Abdomen: soft; nondistended; nontender; bowel sounds present Extremities: No deformity; full range of motion; pulses normal Neurologic: Awake, alert; motor function intact in all extremities and symmetric; no facial droop Skin: Warm and dry Psychiatric: Flat affect; appears anxious   RESULTS  Summary of this visit's results, reviewed and interpreted by myself:   EKG Interpretation  Date/Time:  Saturday August 06 2020 23:52:18 EDT Ventricular Rate:  179 PR Interval:    QRS Duration: 76 QT Interval:  239 QTC Calculation: 446 R Axis:   28 Text Interpretation: Atrial fibrillation with rapid V-rate Repolarization abnormality, prob rate related Previously NSR Confirmed by [employer], Rosabelle Jupin [phone]) on 08/06/2020 11:57:13 PM         EKG Interpretation  Date/Time:  Sunday August 07 2020 02:05:14 EDT Ventricular Rate:   82 PR Interval:  142 QRS Duration: 97 QT Interval:  400 QTC Calculation: 468 R Axis:   15 Text Interpretation: Sinus rhythm Borderline T abnormalities, inferior leads Previously AFib with RVR Confirmed by Shanon Rosser [phone]) on 08/07/2020 2:06:57 AM         Laboratory Studies: Results for orders placed or performed during the hospital encounter of 08/06/20 (from the past 24 hour(s))  CBC with Differential/Platelet     Status: Abnormal   Collection Time: 08/06/20 11:55 PM  Result Value Ref Range   WBC 12.3 (H) 4.0 - 10.5 K/uL   RBC 4.75 3.87 - 5.11 MIL/uL   Hemoglobin 13.4 12.0 - 15.0 g/dL   HCT 39.9 36.0 - 46.0 %   MCV 84.0 80.0 - 100.0 fL   MCH 28.2 26.0 - 34.0 pg   MCHC 33.6 30.0 - 36.0 g/dL   RDW 13.2 11.5 - 15.5 %   Platelets 283 150 - 400 K/uL   nRBC 0.0 0.0 - 0.2 %   Neutrophils Relative % 49 %   Neutro Abs 6.1 1.7 - 7.7 K/uL   Lymphocytes Relative 39 %   Lymphs Abs 4.8 (H) 0.7 - 4.0 K/uL   Monocytes Relative 9 %   Monocytes Absolute 1.1 (H) 0.1 - 1.0 K/uL   Eosinophils Relative 2 %   Eosinophils Absolute 0.3 0.0 - 0.5 K/uL   Basophils Relative 1 %   Basophils Absolute 0.1 0.0 - 0.1 K/uL   Immature Granulocytes 0 %   Abs Immature Granulocytes 0.03 0.00 - 0.07 K/uL  Troponin I (High Sensitivity)     Status: None   Collection Time: 08/06/20 11:55 PM  Result Value Ref Range   Troponin I (High Sensitivity) 10 <18 ng/L  Basic metabolic panel     Status: Abnormal   Collection Time: 08/06/20 11:55 PM  Result Value Ref Range   Sodium 136 135 - 145 mmol/L   Potassium 3.1 (L) 3.5 - 5.1 mmol/L   Chloride 101 98 - 111 mmol/L   CO2 25 22 - 32 mmol/L   Glucose, Bld 153 (H) 70 - 99 mg/dL   BUN 17 6 - 20 mg/dL   Creatinine, Ser 0.75 0.44 - 1.00 mg/dL   Calcium 8.8 (L) 8.9 - 10.3 mg/dL   GFR, Estimated >60 >60 mL/min   Anion gap 10 5 - 15  Troponin I (High Sensitivity)     Status: Abnormal   Collection Time: 08/07/20  2:00 AM  Result Value Ref Range   Troponin I  (High Sensitivity) 115 (HH) <18 ng/L   Imaging Studies: No results found.  ED COURSE and MDM  Nursing notes, initial and subsequent vitals signs, including pulse oximetry, reviewed and interpreted by myself.  Vitals:   08/07/20 0156 08/07/20 0200 08/07/20 0220 08/07/20 0230  BP: (!) 145/74 (!) 126/91 137/73 122/70  Pulse: 86 82 78 79  Resp: 20 16 16 17   Temp: 99 F (37.2 C)     TempSrc: Oral     SpO2: 97% 99% 96% 95%  Weight:  Height:       Medications  diltiazem (CARDIZEM) 1 mg/mL load via infusion 20 mg (20 mg Intravenous Bolus from Bag 08/07/20 0012)  potassium chloride SA (KLOR-CON) CR tablet 40 mEq (40 mEq Oral Given 08/07/20 0036)  etomidate (AMIDATE) injection 11.22 mg (11.22 mg Intravenous Given 08/07/20 0132)   12:18 AM Cardizem bolus and drip initiated.  1:20 AM Rate not adequately controlled despite Cardizem at 15 mg/h.  Rate still in the 140s to 150s.  We will attempt electrical cardioversion given that the onset of the symptoms was within the last few hours.  She is currently on Eliquis for paroxysmal atrial fibrillation.  CHA2DS2/VAS Stroke Risk Points  Current as of a minute ago     2 >= 2 Points: High Risk  1 - 1.99 Points: Medium Risk  0 Points: Low Risk    Last Change: N/A      Details    This score determines the patient's risk of having a stroke if the  patient has atrial fibrillation.     Points Metrics  0 Has Congestive Heart Failure:  No    Current as of a minute ago  0 Has Vascular Disease:  No    Current as of a minute ago  1 Has Hypertension:  Yes    Current as of a minute ago  0 Age:  48    Current as of a minute ago  0 Has Diabetes:  No    Current as of a minute ago  0 Had Stroke:  No  Had TIA:  No  Had Thromboembolism:  No    Current as of a minute ago  1 Female:  Yes    Current as of a minute ago   Above score calculated as 1 point each if present [CHF, HTN, DM, Vascular=MI/PAD/Aortic Plaque, Age if 3-74, or Female] Above score  calculated as 2 points each if present [Age > 75, or Stroke/TIA/TE]  1:58 AM Patient awake, smiling and laughing with staff and family post cardioversion.  Patient now asymptomatic with no pain and no palpitations.  2:43 AM Repeat troponin is mildly elevated, likely due to demand ischemia and/or cardioversion.  She is asymptomatic and I believe she is safe for discharge.  PROCEDURES  .Sedation  Date/Time: 08/07/2020 1:08 AM Performed by: Linzey Ramser, MD Authorized by: Charee Tumblin, MD   Consent:    Consent obtained:  Written   Consent given by:  Patient   Risks discussed:  Respiratory compromise necessitating ventilatory assistance and intubation Universal protocol:    Procedure explained and questions answered to patient or proxy's satisfaction: yes     Relevant documents present and verified: yes     Test results available: yes     Required blood products, implants, devices, and special equipment available: yes     Immediately prior to procedure, a time out was called: yes     Patient identity confirmed:  Arm band and verbally with patient Indications:    Procedure performed:  Cardioversion   Procedure necessitating sedation performed by:  Physician performing sedation Pre-sedation assessment:    Time since last food or drink:  5-6 hours   ASA classification: class 2 - patient with mild systemic disease     Mouth opening:  3 or more finger widths   Mallampati score:  III - soft palate, base of uvula visible   Neck mobility: normal     Pre-sedation assessments completed and reviewed: airway patency, cardiovascular function, hydration status,  mental status, nausea/vomiting, pain level, respiratory function and temperature   Immediate pre-procedure details:    Reassessment: Patient reassessed immediately prior to procedure     Reviewed: vital signs     Verified: bag valve mask available, emergency equipment available, intubation equipment available, IV patency confirmed, oxygen  available and suction available   Procedure details (see MAR for exact dosages):    Preoxygenation:  Room air   Sedation:  Etomidate   Intended level of sedation: deep   Analgesia:  None   Intra-procedure monitoring:  Blood pressure monitoring, frequent LOC assessments, cardiac monitor, continuous pulse oximetry and frequent vital sign checks   Total Provider sedation time (minutes):  10 Post-procedure details:    Post-sedation assessment completed:  08/07/2020 1:57 AM   Attendance: Constant attendance by certified staff until patient recovered     Recovery: Patient returned to pre-procedure baseline     Post-sedation assessments completed and reviewed: airway patency, cardiovascular function, hydration status, mental status, nausea/vomiting, pain level, respiratory function and temperature     Patient is stable for discharge or admission: yes     Procedure completion:  Tolerated well, no immediate complications .Cardioversion  Date/Time: 08/07/2020 1:14 AM Performed by: Ellar Hakala, MD Authorized by: Sailor Hevia, MD   Consent:    Consent obtained:  Written   Consent given by:  Patient   Risks discussed:  Cutaneous burn   Alternatives discussed:  Rate-control medication Pre-procedure details:    Cardioversion basis:  Emergent   Rhythm:  Atrial fibrillation   Electrode placement:  Anterior-posterior Patient sedated: Yes. Refer to sedation procedure documentation for details of sedation.  Attempt one:    Cardioversion mode:  Synchronous   Waveform:  Biphasic   Shock (Joules):  200   Shock outcome:  Conversion to normal sinus rhythm Post-procedure details:    Patient status:  Awake (After etomidate sedation wore off)   Patient tolerance of procedure:  Tolerated well, no immediate complications   ED DIAGNOSES     ICD-10-CM   1. Paroxysmal atrial fibrillation with rapid ventricular response (HCC)  I48.0     2. Hypokalemia  E87.6          Junior Huezo, Jenny Reichmann, MD 08/07/20  [phone]

## 2020-08-07 NOTE — Sedation Documentation (Signed)
Medication dose calculated and verified for: Etomidate 11.2 mg

## 2020-08-07 NOTE — ED Notes (Signed)
Ambulated to restroom, tolerated well

## 2020-08-07 NOTE — Sedation Documentation (Signed)
Pain not assessable due to patient asleep.

## 2020-08-09 ENCOUNTER — Telehealth (HOSPITAL_COMMUNITY): Payer: Self-pay

## 2020-08-09 NOTE — Telephone Encounter (Signed)
Reached out to patient to schedule ED f/u. Call interpreter [phone]. She stated she needed our number to give to daughter to call back and schedule.

## 2020-10-30 ENCOUNTER — Encounter (HOSPITAL_BASED_OUTPATIENT_CLINIC_OR_DEPARTMENT_OTHER): Payer: Self-pay | Admitting: *Deleted

## 2020-10-30 ENCOUNTER — Other Ambulatory Visit: Payer: Self-pay

## 2020-10-30 ENCOUNTER — Emergency Department (HOSPITAL_BASED_OUTPATIENT_CLINIC_OR_DEPARTMENT_OTHER)
Admission: EM | Admit: 2020-10-30 | Discharge: 2020-10-30 | Disposition: A | Discharge status: Home / Self Care | Payer: Self-pay | Attending: Emergency Medicine | Admitting: Emergency Medicine

## 2020-10-30 DIAGNOSIS — I10 Essential (primary) hypertension: Secondary | ICD-10-CM | POA: Insufficient documentation

## 2020-10-30 DIAGNOSIS — I4891 Unspecified atrial fibrillation: Secondary | ICD-10-CM | POA: Insufficient documentation

## 2020-10-30 LAB — COMPREHENSIVE METABOLIC PANEL
ALT: 26 U/L (ref 0–44)
AST: 25 U/L (ref 15–41)
Albumin: 4.2 g/dL (ref 3.5–5.0)
Alkaline Phosphatase: 94 U/L (ref 38–126)
Anion gap: 6 (ref 5–15)
BUN: 11 mg/dL (ref 6–20)
CO2: 27 mmol/L (ref 22–32)
Calcium: 8.9 mg/dL (ref 8.9–10.3)
Chloride: 106 mmol/L (ref 98–111)
Creatinine, Ser: 0.57 mg/dL (ref 0.44–1.00)
GFR, Estimated: >60 mL/min (ref >60)
Glucose, Bld: 147 mg/dL — ABNORMAL HIGH (ref 70–99)
Potassium: 3.1 mmol/L — ABNORMAL LOW (ref 3.5–5.1)
Sodium: 139 mmol/L (ref 135–145)
Total Bilirubin: 0.4 mg/dL (ref 0.3–1.2)
Total Protein: 8.2 g/dL — ABNORMAL HIGH (ref 6.5–8.1)

## 2020-10-30 LAB — CBC
HCT: 40.3 % (ref 36.0–46.0)
Hemoglobin: 13.6 g/dL (ref 12.0–15.0)
MCH: 27.1 pg (ref 26.0–34.0)
MCHC: 33.7 g/dL (ref 30.0–36.0)
MCV: 80.4 fL (ref 80.0–100.0)
Platelets: 291 10*3/uL (ref 150–400)
RBC: 5.01 MIL/uL (ref 3.87–5.11)
RDW: 13.9 % (ref 11.5–15.5)
WBC: 10.5 10*3/uL (ref 4.0–10.5)
nRBC: 0 % (ref 0.0–0.2)

## 2020-10-30 LAB — MAGNESIUM: Magnesium: 2.3 mg/dL (ref 1.7–2.4)

## 2020-10-30 MED ORDER — SODIUM CHLORIDE 0.9 % IV SOLN: INTRAVENOUS | Status: DC | PRN

## 2020-10-30 MED ORDER — DILTIAZEM HCL 25 MG/5ML IV SOLN
INTRAVENOUS | Status: AC
  Administered 2020-10-30: 15 mg via INTRAVENOUS
  Filled 2020-10-30: qty 5

## 2020-10-30 MED ORDER — ETOMIDATE 2 MG/ML IV SOLN
10.0000 mg | Freq: Once | INTRAVENOUS | Status: AC
  Administered 2020-10-30: 10 mg via INTRAVENOUS
  Filled 2020-10-30: qty 10

## 2020-10-30 MED ORDER — DILTIAZEM HCL 25 MG/5ML IV SOLN: 15.0000 mg | Freq: Once | INTRAVENOUS | Status: AC

## 2020-10-30 MED ORDER — SODIUM CHLORIDE 0.9 % IV BOLUS
1000.0000 mL | Freq: Once | INTRAVENOUS | Status: AC
  Administered 2020-10-30: 1000 mL via INTRAVENOUS

## 2020-10-30 NOTE — Sedation Documentation (Signed)
Synchronized shock delivered at 150 J,. Pt converted to NSR with a rate of 95.

## 2020-10-30 NOTE — ED Provider Notes (Signed)
[facility] Emergency Department Provider Note MRN:  [id number]  Arrival date & time: 10/30/20     Chief Complaint   Atrial Fibrillation   History of Present Illness   Whitney Juarez is a 55 y.o. year-old female with a history of Afib presenting to the ED with chief complaint of palpitations.  Location:  chest Duration:  2-3 hours Onset:  sudden Timing:  constant Description:  fluttering in chest Severity:  mild Exacerbating/Alleviating Factors:  none Associated Symptoms:  none Pertinent Negatives:  no chest pain, no SOB, no other complaints  Additional History:  forgot to take diltiazem this evening  Review of Systems  A complete 10 system review of systems was obtained and all systems are negative except as noted in the HPI and PMH.   Patient's Health History    Past Medical History:  Diagnosis Date   Aortic regurgitation 04/20/2016   Atrial fibrillation ([place]) 05/27/2015   Hypertension 05/27/2015   Severe aortic stenosis 03/12/2016   Subaortic stenosis 05/26/2016    Past Surgical History:  Procedure Laterality Date   BUBBLE STUDY  06/10/2020   Procedure: BUBBLE STUDY;  Surgeon: Fay Records, MD;  Location: [place];  Service: Cardiovascular;;   CESAREAN SECTION     TEE WITHOUT CARDIOVERSION N/A 03/27/2016   Procedure: TRANSESOPHAGEAL ECHOCARDIOGRAM (TEE);  Surgeon: Skeet Latch, MD;  Location: [place];  Service: Cardiovascular;  Laterality: N/A;   TEE WITHOUT CARDIOVERSION N/A 06/10/2020   Procedure: TRANSESOPHAGEAL ECHOCARDIOGRAM (TEE);  Surgeon: Fay Records, MD;  Location: [facility] ENDOSCOPY;  Service: Cardiovascular;  Laterality: N/A;    Family History  Problem Relation Age of Onset   Heart disease Brother     Social History   Socioeconomic History   Marital status: Married    Spouse name: Not on file   Number of children: Not on file   Years of education: Not on file   Highest education level: Not on file   Occupational History   Not on file  Tobacco Use   Smoking status: Never   Smokeless tobacco: Never  Substance and Sexual Activity   Alcohol use: No   Drug use: No   Sexual activity: Not on file  Other Topics Concern   Not on file  Social History Narrative   Not on file   Social Determinants of Health   Financial Resource Strain: Not on file  Food Insecurity: Not on file  Transportation Needs: Not on file  Physical Activity: Not on file  Stress: Not on file  Social Connections: Not on file  Intimate Partner Violence: Not on file     Physical Exam   Vitals:   10/30/20 0232 10/30/20 0247  BP: 127/70 123/80  Pulse: 80 80  Resp: 16 16  Temp: 98.1 F (36.7 C) 98 F (36.7 C)  SpO2: 96% 97%    CONSTITUTIONAL:  well-appearing, NAD NEURO:  Alert and oriented x 3, no focal deficits EYES:  eyes equal and reactive ENT/NECK:  no LAD, no JVD CARDIO:  tachycardic rate, well-perfused, normal S1 and S2 PULM:  CTAB no wheezing or rhonchi GI/GU:  normal bowel sounds, non-distended, non-tender MSK/SPINE:  No gross deformities, no edema SKIN:  no rash, atraumatic PSYCH:  Appropriate speech and behavior  *Additional and/or pertinent findings included in MDM below  Diagnostic and Interventional Summary    EKG Interpretation  Date/Time:  Sunday October 30 2020 00:19:04 EDT Ventricular Rate:  170 PR Interval:    QRS Duration: 82  QT Interval:  226 QTC Calculation: 380 R Axis:   36 Text Interpretation: Atrial fibrillation with rapid V-rate Repolarization abnormality, prob rate related Confirmed by Gerlene Fee [phone]) on 10/30/2020 2:24:29 AM        EKG Interpretation  Date/Time:  Sunday October 30 2020 02:09:32 EDT Ventricular Rate:  95 PR Interval:  155 QRS Duration: 89 QT Interval:  364 QTC Calculation: 458 R Axis:   24 Text Interpretation: Sinus rhythm Consider left atrial enlargement Anteroseptal infarct, age indeterminate Confirmed by Gerlene Fee [phone])  on 10/30/2020 2:27:29 AM        Labs Reviewed  COMPREHENSIVE METABOLIC PANEL - Abnormal; Notable for the following components:      Result Value   Potassium 3.1 (*)    Glucose, Bld 147 (*)    Total Protein 8.2 (*)    All other components within normal limits  CBC  MAGNESIUM    No orders to display    Medications  sodium chloride 0.9 % bolus 1,000 mL (0 mLs Intravenous Stopped 10/30/20 0140)  diltiazem (CARDIZEM) injection 15 mg (15 mg Intravenous Given 10/30/20 0026)  etomidate (AMIDATE) injection 10 mg (10 mg Intravenous Given by Other 10/30/20 0201)     Procedures  /  Critical Care .Critical Care Performed by: Maudie Flakes, MD Authorized by: Maudie Flakes, MD   Critical care provider statement:    Critical care time (minutes):  45   Critical care was necessary to treat or prevent imminent or life-threatening deterioration of the following conditions: Afib with RVR.   Critical care was time spent personally by me on the following activities:  Discussions with consultants, evaluation of patient's response to treatment, examination of patient, ordering and performing treatments and interventions, ordering and review of laboratory studies, ordering and review of radiographic studies, pulse oximetry, re-evaluation of patient's condition, obtaining history from patient or surrogate and review of old charts .Cardioversion  Date/Time: 10/30/2020 2:24 AM Performed by: Maudie Flakes, MD Authorized by: Maudie Flakes, MD   Consent:    Consent obtained:  Verbal and written   Consent given by:  Patient   Risks discussed:  Cutaneous burn, death, induced arrhythmia and pain Universal protocol:    Procedure explained and questions answered to patient or proxy's satisfaction: yes (with interpreter)     Immediately prior to procedure a time out was called: yes     Patient identity confirmed:  Verbally with patient and arm band Pre-procedure details:    Cardioversion basis:   Elective   Rhythm:  Atrial fibrillation   Electrode placement:  Anterior-posterior Patient sedated: Yes. Refer to sedation procedure documentation for details of sedation.  Attempt one:    Cardioversion mode:  Synchronous   Waveform:  Biphasic   Shock (Joules):  100   Shock outcome:  No change in rhythm Attempt two:    Cardioversion mode:  Synchronous   Waveform:  Biphasic   Shock (Joules):  150   Shock outcome:  Conversion to normal sinus rhythm Post-procedure details:    Patient status:  Awake   Patient tolerance of procedure:  Tolerated well, no immediate complications .Sedation  Date/Time: 10/30/2020 2:25 AM Performed by: Maudie Flakes, MD Authorized by: Maudie Flakes, MD   Consent:    Consent obtained:  Verbal and written   Consent given by:  Patient   Risks discussed:  Allergic reaction, dysrhythmia, inadequate sedation, nausea, vomiting, respiratory compromise necessitating ventilatory assistance and intubation and prolonged hypoxia resulting in organ  damage Universal protocol:    Procedure explained and questions answered to patient or proxy's satisfaction: yes     Immediately prior to procedure, a time out was called: yes     Patient identity confirmed:  Verbally with patient and hospital-assigned identification number Indications:    Procedure performed:  Cardioversion   Procedure necessitating sedation performed by:  Physician performing sedation Pre-sedation assessment:    Time since last food or drink:  4 hours   ASA classification: class 1 - normal, healthy patient     Mouth opening:  3 or more finger widths   Mallampati score:  I - soft palate, uvula, fauces, pillars visible   Neck mobility: normal     Pre-sedation assessments completed and reviewed: airway patency, cardiovascular function, hydration status, mental status, nausea/vomiting, pain level, respiratory function and temperature   Immediate pre-procedure details:    Reassessment: Patient  reassessed immediately prior to procedure     Reviewed: vital signs     Verified: bag valve mask available, emergency equipment available, intubation equipment available, IV patency confirmed, oxygen available and suction available   Procedure details (see MAR for exact dosages):    Preoxygenation:  Nasal cannula   Sedation:  Etomidate   Intended level of sedation: deep   Intra-procedure monitoring:  Blood pressure monitoring, frequent LOC assessments, frequent vital sign checks, continuous pulse oximetry, cardiac monitor and continuous capnometry   Intra-procedure events: none     Total Provider sedation time (minutes):  17 Post-procedure details:    Attendance: Constant attendance by certified staff until patient recovered     Recovery: Patient returned to pre-procedure baseline     Post-sedation assessments completed and reviewed: airway patency, cardiovascular function, hydration status, mental status, nausea/vomiting, pain level, respiratory function and temperature     Patient is stable for discharge or admission: yes     Procedure completion:  Tolerated well, no immediate complications  ED Course and Medical Decision Making  I have reviewed the triage vital signs, the nursing notes, and pertinent available records from the EMR.  Listed above are laboratory and imaging tests that I personally ordered, reviewed, and interpreted and then considered in my medical decision making (see below for details).  A. fib with RVR, patient presenting with palpitations, not having any chest pain or shortness of breath, symptoms are very mild.  Forgot to take her diltiazem this evening.  Has not missed any doses of her blood thinner in the past month, sudden onset, good candidate for cardioversion.  See procedural details above.  Initially presenting with rates in the 180s, improved to 150s after a small dose of diltiazem.  Cardioversion was successful, now in sinus rhythm, anticipating will be able to  discharge with cardiology follow-up.      This patients CHA2DS2-VASc Score and unadjusted Ischemic Stroke Rate (% per year) is equal to 2.2 % stroke rate/year from a score of 2  Above score calculated as 1 point each if present [CHF, HTN, DM, Vascular=MI/PAD/Aortic Plaque, Age if 65-74, or Female] Above score calculated as 2 points each if present [Age > 75, or Stroke/TIA/TE]    Barth Kirks. Sedonia Small, MD [place] [email or web address]  Final Clinical Impressions(s) / ED Diagnoses     ICD-10-CM   1. Atrial fibrillation with RVR ([place])  I48.91       ED Discharge Orders          Ordered    Amb Referral to AFIB Clinic  10/30/20 0256             Discharge Instructions Discussed with and Provided to Patient:     Discharge Instructions      You were evaluated in the Emergency Department and after careful evaluation, we did not find any emergent condition requiring admission or further testing in the hospital.  Your exam/testing today was overall reassuring.  Symptoms due to A. fib with a rapid heart rhythm.  We did a cardioversion here in the emergency department to return due to a normal heart rhythm.  Recommend close follow-up with cardiology and taking your medicines like normal.  Please return to the Emergency Department if you experience any worsening of your condition.  Thank you for allowing [place] to be a part of your care.         Maudie Flakes, MD 10/30/20 [phone]

## 2020-10-30 NOTE — ED Notes (Signed)
Discharged home with daughter Whitney Juarez

## 2020-10-30 NOTE — Sedation Documentation (Addendum)
Synchronized shock delivered at 100 J. Pt remained in AFib with RVR.

## 2020-10-30 NOTE — ED Notes (Signed)
Consent obtained by Spanish interpreter line. Interpreter number [phone]

## 2020-10-30 NOTE — Discharge Instructions (Addendum)
You were evaluated in the Emergency Department and after careful evaluation, we did not find any emergent condition requiring admission or further testing in the hospital.  Your exam/testing today was overall reassuring.  Symptoms due to A. fib with a rapid heart rhythm.  We did a cardioversion here in the emergency department to return due to a normal heart rhythm.  Recommend close follow-up with cardiology and taking your medicines like normal.  Please return to the Emergency Department if you experience any worsening of your condition.  Thank you for allowing [place] to be a part of your care.

## 2020-10-30 NOTE — ED Triage Notes (Signed)
Tachycardia and palpitations 45 minutes PTA. Denies pain, denies SOB

## 2020-11-03 ENCOUNTER — Other Ambulatory Visit: Payer: Self-pay | Admitting: Internal Medicine

## 2020-11-03 NOTE — Telephone Encounter (Signed)
Prescription refill request for Eliquis received.  Indication: afib Last office visit: 07/08/2020, [place] Scr: 0.57,  Age: 55 yo  Weight: 74.4 kg   Refill sent

## 2020-11-14 ENCOUNTER — Ambulatory Visit (HOSPITAL_BASED_OUTPATIENT_CLINIC_OR_DEPARTMENT_OTHER): Payer: BC Managed Care – PPO | Admitting: Cardiovascular Disease

## 2020-12-20 ENCOUNTER — Other Ambulatory Visit: Payer: Self-pay | Admitting: *Deleted

## 2020-12-20 DIAGNOSIS — I350 Nonrheumatic aortic (valve) stenosis: Secondary | ICD-10-CM

## 2020-12-23 ENCOUNTER — Other Ambulatory Visit: Payer: Self-pay | Admitting: Internal Medicine

## 2021-01-03 ENCOUNTER — Emergency Department (HOSPITAL_BASED_OUTPATIENT_CLINIC_OR_DEPARTMENT_OTHER): Payer: Self-pay

## 2021-01-03 ENCOUNTER — Observation Stay (HOSPITAL_BASED_OUTPATIENT_CLINIC_OR_DEPARTMENT_OTHER)
Admission: EM | Admit: 2021-01-03 | Discharge: 2021-01-05 | Disposition: A | Discharge status: Home / Self Care | Payer: Self-pay | Attending: Internal Medicine | Admitting: Internal Medicine

## 2021-01-03 ENCOUNTER — Other Ambulatory Visit: Payer: Self-pay

## 2021-01-03 ENCOUNTER — Encounter (HOSPITAL_BASED_OUTPATIENT_CLINIC_OR_DEPARTMENT_OTHER): Payer: Self-pay | Admitting: Emergency Medicine

## 2021-01-03 DIAGNOSIS — Q244 Congenital subaortic stenosis: Secondary | ICD-10-CM

## 2021-01-03 DIAGNOSIS — I4891 Unspecified atrial fibrillation: Principal | ICD-10-CM | POA: Insufficient documentation

## 2021-01-03 DIAGNOSIS — I10 Essential (primary) hypertension: Secondary | ICD-10-CM | POA: Insufficient documentation

## 2021-01-03 DIAGNOSIS — D649 Anemia, unspecified: Secondary | ICD-10-CM | POA: Diagnosis present

## 2021-01-03 DIAGNOSIS — E876 Hypokalemia: Secondary | ICD-10-CM | POA: Insufficient documentation

## 2021-01-03 DIAGNOSIS — Z20822 Contact with and (suspected) exposure to covid-19: Secondary | ICD-10-CM | POA: Insufficient documentation

## 2021-01-03 DIAGNOSIS — Z7901 Long term (current) use of anticoagulants: Secondary | ICD-10-CM | POA: Insufficient documentation

## 2021-01-03 LAB — CBC
HCT: 33.2 % — ABNORMAL LOW (ref 36.0–46.0)
Hemoglobin: 11 g/dL — ABNORMAL LOW (ref 12.0–15.0)
MCH: 26.4 pg (ref 26.0–34.0)
MCHC: 33.1 g/dL (ref 30.0–36.0)
MCV: 79.8 fL — ABNORMAL LOW (ref 80.0–100.0)
Platelets: 348 10*3/uL (ref 150–400)
RBC: 4.16 MIL/uL (ref 3.87–5.11)
RDW: 13.6 % (ref 11.5–15.5)
WBC: 9.3 10*3/uL (ref 4.0–10.5)
nRBC: 0 % (ref 0.0–0.2)

## 2021-01-03 LAB — BASIC METABOLIC PANEL
Anion gap: 8 (ref 5–15)
BUN: 12 mg/dL (ref 6–20)
CO2: 23 mmol/L (ref 22–32)
Calcium: 8.5 mg/dL — ABNORMAL LOW (ref 8.9–10.3)
Chloride: 108 mmol/L (ref 98–111)
Creatinine, Ser: 0.49 mg/dL (ref 0.44–1.00)
GFR, Estimated: >60 mL/min (ref >60)
Glucose, Bld: 122 mg/dL — ABNORMAL HIGH (ref 70–99)
Potassium: 3.4 mmol/L — ABNORMAL LOW (ref 3.5–5.1)
Sodium: 139 mmol/L (ref 135–145)

## 2021-01-03 LAB — PREGNANCY, URINE: Preg Test, Ur: NEGATIVE

## 2021-01-03 LAB — TROPONIN I (HIGH SENSITIVITY): Troponin I (High Sensitivity): 8 ng/L (ref <18)

## 2021-01-03 NOTE — ED Triage Notes (Signed)
Per pt son complaint of high blood pressure, feels like high heart rate pain in left arm and neck. Started  about 7pm.

## 2021-01-04 ENCOUNTER — Encounter (HOSPITAL_BASED_OUTPATIENT_CLINIC_OR_DEPARTMENT_OTHER): Payer: Self-pay | Admitting: Emergency Medicine

## 2021-01-04 ENCOUNTER — Observation Stay (HOSPITAL_BASED_OUTPATIENT_CLINIC_OR_DEPARTMENT_OTHER): Payer: Self-pay

## 2021-01-04 DIAGNOSIS — E876 Hypokalemia: Secondary | ICD-10-CM | POA: Diagnosis present

## 2021-01-04 DIAGNOSIS — I4891 Unspecified atrial fibrillation: Secondary | ICD-10-CM

## 2021-01-04 DIAGNOSIS — D649 Anemia, unspecified: Secondary | ICD-10-CM

## 2021-01-04 DIAGNOSIS — Q244 Congenital subaortic stenosis: Secondary | ICD-10-CM

## 2021-01-04 LAB — CBC WITH DIFFERENTIAL/PLATELET
Abs Immature Granulocytes: 0.03 10*3/uL (ref 0.00–0.07)
Basophils Absolute: 0.1 10*3/uL (ref 0.0–0.1)
Basophils Relative: 1 %
Eosinophils Absolute: 0 10*3/uL (ref 0.0–0.5)
Eosinophils Relative: 0 %
HCT: 32.8 % — ABNORMAL LOW (ref 36.0–46.0)
Hemoglobin: 10.6 g/dL — ABNORMAL LOW (ref 12.0–15.0)
Immature Granulocytes: 0 %
Lymphocytes Relative: 14 %
Lymphs Abs: 1.2 10*3/uL (ref 0.7–4.0)
MCH: 26.2 pg (ref 26.0–34.0)
MCHC: 32.3 g/dL (ref 30.0–36.0)
MCV: 81.2 fL (ref 80.0–100.0)
Monocytes Absolute: 0.8 10*3/uL (ref 0.1–1.0)
Monocytes Relative: 8 %
Neutro Abs: 6.8 10*3/uL (ref 1.7–7.7)
Neutrophils Relative %: 77 %
Platelets: 343 10*3/uL (ref 150–400)
RBC: 4.04 MIL/uL (ref 3.87–5.11)
RDW: 13.6 % (ref 11.5–15.5)
WBC: 8.9 10*3/uL (ref 4.0–10.5)
nRBC: 0 % (ref 0.0–0.2)

## 2021-01-04 LAB — TSH: TSH: 1.484 u[IU]/mL (ref 0.350–4.500)

## 2021-01-04 LAB — BASIC METABOLIC PANEL
Anion gap: 7 (ref 5–15)
BUN: 7 mg/dL (ref 6–20)
CO2: 22 mmol/L (ref 22–32)
Calcium: 8.4 mg/dL — ABNORMAL LOW (ref 8.9–10.3)
Chloride: 110 mmol/L (ref 98–111)
Creatinine, Ser: 0.5 mg/dL (ref 0.44–1.00)
GFR, Estimated: >60 mL/min (ref >60)
Glucose, Bld: 111 mg/dL — ABNORMAL HIGH (ref 70–99)
Potassium: 3.7 mmol/L (ref 3.5–5.1)
Sodium: 139 mmol/L (ref 135–145)

## 2021-01-04 LAB — TROPONIN I (HIGH SENSITIVITY): Troponin I (High Sensitivity): 12 ng/L (ref <18)

## 2021-01-04 LAB — MAGNESIUM: Magnesium: 2.5 mg/dL — ABNORMAL HIGH (ref 1.7–2.4)

## 2021-01-04 LAB — ECHOCARDIOGRAM COMPLETE
AV Mean grad: 29.3 mmHg
AV Peak grad: 46.2 mmHg
Ao pk vel: 3.4 m/s
Area-P 1/2: 2.43 cm2
Height: 59 in
P 1/2 time: 402 msec
S' Lateral: 3.1 cm
Weight: 2490.32 oz

## 2021-01-04 LAB — RESP PANEL BY RT-PCR (FLU A&B, COVID) ARPGX2
Influenza A by PCR: NEGATIVE
Influenza B by PCR: NEGATIVE
SARS Coronavirus 2 by RT PCR: NEGATIVE

## 2021-01-04 LAB — HIV ANTIBODY (ROUTINE TESTING W REFLEX): HIV Screen 4th Generation wRfx: NONREACTIVE

## 2021-01-04 MED ORDER — ALBUTEROL SULFATE (2.5 MG/3ML) 0.083% IN NEBU: 2.5000 mg | INHALATION_SOLUTION | Freq: Four times a day (QID) | RESPIRATORY_TRACT | Status: DC | PRN

## 2021-01-04 MED ORDER — ONDANSETRON HCL 4 MG/2ML IJ SOLN: 4.0000 mg | Freq: Four times a day (QID) | INTRAMUSCULAR | Status: DC | PRN

## 2021-01-04 MED ORDER — POTASSIUM CHLORIDE CRYS ER 20 MEQ PO TBCR
40.0000 meq | EXTENDED_RELEASE_TABLET | Freq: Once | ORAL | Status: AC
  Administered 2021-01-04: 40 meq via ORAL
  Filled 2021-01-04: qty 2

## 2021-01-04 MED ORDER — IBUPROFEN 600 MG PO TABS
600.0000 mg | ORAL_TABLET | Freq: Once | ORAL | Status: AC
  Administered 2021-01-04: 600 mg via ORAL
  Filled 2021-01-04: qty 1

## 2021-01-04 MED ORDER — ACETAMINOPHEN 650 MG RE SUPP: 650.0000 mg | Freq: Four times a day (QID) | RECTAL | Status: DC | PRN

## 2021-01-04 MED ORDER — MAGNESIUM SULFATE 2 GM/50ML IV SOLN: 2.0000 g | Freq: Once | INTRAVENOUS | Status: AC

## 2021-01-04 MED ORDER — DILTIAZEM HCL 60 MG PO TABS
60.0000 mg | ORAL_TABLET | Freq: Four times a day (QID) | ORAL | Status: DC
  Administered 2021-01-04 – 2021-01-05 (×3): 60 mg via ORAL
  Filled 2021-01-04 (×3): qty 1

## 2021-01-04 MED ORDER — APIXABAN 5 MG PO TABS: 5.0000 mg | ORAL_TABLET | Freq: Two times a day (BID) | ORAL | Status: DC

## 2021-01-04 MED ORDER — DILTIAZEM LOAD VIA INFUSION
10.0000 mg | Freq: Once | INTRAVENOUS | Status: AC
  Administered 2021-01-04: 10 mg via INTRAVENOUS
  Filled 2021-01-04: qty 10

## 2021-01-04 MED ORDER — CALCIUM GLUCONATE-NACL 2-0.675 GM/100ML-% IV SOLN
2.0000 g | Freq: Once | INTRAVENOUS | Status: AC
  Administered 2021-01-04: 2000 mg via INTRAVENOUS
  Filled 2021-01-04: qty 100

## 2021-01-04 MED ORDER — DILTIAZEM HCL-DEXTROSE 125-5 MG/125ML-% IV SOLN (PREMIX)
5.0000 mg/h | INTRAVENOUS | Status: AC
  Administered 2021-01-04: 12.5 mg/h via INTRAVENOUS
  Administered 2021-01-04: 5 mg/h via INTRAVENOUS
  Filled 2021-01-04 (×2): qty 125

## 2021-01-04 MED ORDER — ONDANSETRON HCL 4 MG PO TABS: 4.0000 mg | ORAL_TABLET | Freq: Four times a day (QID) | ORAL | Status: DC | PRN

## 2021-01-04 MED ORDER — DILTIAZEM HCL 25 MG/5ML IV SOLN: 10.0000 mg | Freq: Once | INTRAVENOUS | Status: DC

## 2021-01-04 MED ORDER — ACETAMINOPHEN 325 MG PO TABS: 650.0000 mg | ORAL_TABLET | Freq: Four times a day (QID) | ORAL | Status: DC | PRN

## 2021-01-04 MED ORDER — ENOXAPARIN SODIUM 80 MG/0.8ML IJ SOSY
1.0000 mg/kg | PREFILLED_SYRINGE | Freq: Once | INTRAMUSCULAR | Status: AC
  Administered 2021-01-04: 75 mg via SUBCUTANEOUS
  Filled 2021-01-04: qty 0.8

## 2021-01-04 MED ORDER — APIXABAN 5 MG PO TABS
5.0000 mg | ORAL_TABLET | Freq: Two times a day (BID) | ORAL | Status: DC
  Administered 2021-01-04 – 2021-01-05 (×3): 5 mg via ORAL
  Filled 2021-01-04 (×3): qty 1

## 2021-01-04 MED ORDER — POTASSIUM CHLORIDE CRYS ER 20 MEQ PO TBCR
30.0000 meq | EXTENDED_RELEASE_TABLET | ORAL | Status: AC
  Administered 2021-01-04: 30 meq via ORAL
  Filled 2021-01-04: qty 1

## 2021-01-04 MED ORDER — MAGNESIUM SULFATE 2 GM/50ML IV SOLN
INTRAVENOUS | Status: AC
  Administered 2021-01-04: 2 g via INTRAVENOUS
  Filled 2021-01-04: qty 50

## 2021-01-04 NOTE — ED Provider Notes (Signed)
[facility] EM[facility]ENCY DEPARTMENT Provider Note   CSN: [id number] Arrival date & time: 01/03/21  2047     History Chief Complaint  Patient presents with   Hypertension    Whitney Juarez is a 55 y.o. female.  The history is provided by the patient.  Hypertension This is a chronic problem. The current episode started more than 1 week ago. The problem occurs constantly. The problem has not changed since onset.Pertinent negatives include no chest pain, no abdominal pain and no shortness of breath. Nothing aggravates the symptoms. She has tried nothing for the symptoms. The treatment provided no relief.  Palpitations Palpitations quality:  Fast Onset quality:  Sudden Duration: hours. Timing:  Constant Progression:  Unchanged Chronicity:  Recurrent Context: not stimulant use   Context comment:  Out of her medications and could not follow up with cardiology as she lost her insurance. 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   Aortic regurgitation 04/20/2016   Atrial fibrillation ([place]) 05/27/2015   Hypertension 05/27/2015    Past Surgical History:  Procedure Laterality Date   BUBBLE STUDY  06/10/2020   Procedure: BUBBLE STUDY;  Surgeon: Fay Records, MD;  Location: [facility];  Service: Cardiovascular;;   CESAREAN SECTION     TEE WITHOUT CARDIOVERSION N/A 03/27/2016   Procedure: TRANSESOPHAGEAL ECHOCARDIOGRAM (TEE);  Surgeon: Skeet Latch, MD;  Location:  [facility];  Service: Cardiovascular;  Laterality: N/A;   TEE WITHOUT CARDIOVERSION N/A 06/10/2020   Procedure: TRANSESOPHAGEAL ECHOCARDIOGRAM (TEE);  Surgeon: Fay Records, MD;  Location: [facility] ENDOSCOPY;  Service: Cardiovascular;  Laterality: N/A;     OB History   No obstetric history on file.     Family History  Problem Relation Age of Onset   Heart disease Brother     Social History   Tobacco Use   Smoking status: Never   Smokeless tobacco: Never  Substance Use Topics   Alcohol use: No   Drug use: No    Home Medications Prior to Admission medications   Medication Sig Start Date End Date Taking? Authorizing Provider  diltiazem (CARDIZEM CD) 240 MG 24 hr capsule TAKE 1 CAPSULE BY MOUTH EVERY DAY 12/23/20   Skeet Latch, MD  ELIQUIS 5 MG TABS tablet TAKE 1 TABLET BY MOUTH TWICE A DAY 11/03/20   Chandrasekhar, Mahesh A, MD  ibuprofen (ADVIL,MOTRIN) 600 MG tablet Take 1 tablet (600 mg total) by mouth every 6 (six) hours as needed. 06/29/16   Drenda Freeze, MD  potassium chloride SA (KLOR-CON) 20 MEQ tablet Take 1 tablet (20 mEq total) by mouth daily. 08/07/20   Molpus, Jenny Reichmann, MD    Allergies    Patient has no known allergies.  Review of Systems   Review of Systems  Constitutional:  Negative for diaphoresis.  HENT:  Negative for facial swelling.   Eyes:  Negative for redness.  Respiratory:  Negative for shortness of breath.   Cardiovascular:  Positive for palpitations. Negative for chest pain  and syncope.  Gastrointestinal:  Negative for abdominal pain.  Genitourinary:  Negative for difficulty urinating.  Musculoskeletal:  Negative for neck stiffness.  Skin:  Negative for rash.  Neurological:  Negative for facial asymmetry.  Psychiatric/Behavioral:  Negative for agitation.   All other systems reviewed and are negative.  Physical Exam Updated Vital Signs BP 112/64   Pulse 70   Temp 98.4 F (36.9 C) (Oral)   Resp 15   Ht 4\' 11"  (1.499 m)   Wt 74.4 kg   SpO2  96%   BMI 33.12 kg/m   Physical Exam Vitals and nursing note reviewed.  Constitutional:      Appearance: Normal appearance. She is not diaphoretic.  HENT:     Head: Normocephalic and atraumatic.     Nose: Nose normal.  Eyes:     Conjunctiva/sclera: Conjunctivae normal.     Pupils: Pupils are equal, round, and reactive to light.  Cardiovascular:     Rate and Rhythm: Tachycardia present. Rhythm irregular.     Pulses: Normal pulses.     Heart sounds: Normal heart sounds.  Pulmonary:     Effort: Pulmonary effort is normal.     Breath sounds: Normal breath sounds.  Abdominal:     General: Abdomen is flat. Bowel sounds are normal.     Palpations: Abdomen is soft.     Tenderness: There is no abdominal tenderness. There is no guarding.  Musculoskeletal:     Right lower leg: No edema.     Left lower leg: No edema.  Skin:    General: Skin is warm and dry.     Capillary Refill: Capillary refill takes less than 2 seconds.  Neurological:     General: No focal deficit present.     Mental Status: She is alert and oriented to person, place, and time.  Psychiatric:        Mood and Affect: Mood normal.        Behavior: Behavior normal.    ED Results / Procedures / Treatments   Labs (all labs ordered are listed, but only abnormal results are displayed) Results for orders placed or performed during the hospital encounter of 01/03/21  Resp Panel by RT-PCR (Flu A&B, Covid) Nasopharyngeal Swab   Specimen: Nasopharyngeal Swab; Nasopharyngeal(NP) swabs in vial transport medium  Result Value Ref Range   SARS Coronavirus 2 by RT PCR NEGATIVE NEGATIVE   Influenza A by PCR NEGATIVE NEGATIVE   Influenza B by PCR NEGATIVE NEGATIVE  Basic metabolic panel  Result Value Ref Range   Sodium 139 135 - 145 mmol/L   Potassium 3.4 (L) 3.5 - 5.1 mmol/L   Chloride 108 98 - 111 mmol/L   CO2 23 22 - 32 mmol/L   Glucose, Bld 122 (H) 70 - 99 mg/dL   BUN 12 6 - 20 mg/dL   Creatinine, Ser 0.49 0.44 - 1.00  mg/dL   Calcium 8.5 (L) 8.9 - 10.3 mg/dL   GFR, Estimated >60 >60 mL/min   Anion gap 8 5 - 15  CBC  Result Value Ref Range   WBC 9.3 4.0 - 10.5 K/uL   RBC 4.16 3.87 - 5.11 MIL/uL   Hemoglobin 11.0 (L) 12.0 - 15.0 g/dL   HCT 33.2 (L) 36.0 - 46.0 %   MCV 79.8 (L) 80.0 - 100.0 fL   MCH 26.4 26.0 - 34.0 pg   MCHC 33.1 30.0 - 36.0 g/dL   RDW 13.6 11.5 - 15.5 %   Platelets 348 150 -  400 K/uL   nRBC 0.0 0.0 - 0.2 %  Pregnancy, urine  Result Value Ref Range   Preg Test, Ur NEGATIVE NEGATIVE  Troponin I (High Sensitivity)  Result Value Ref Range   Troponin I (High Sensitivity) 8 <18 ng/L  Troponin I (High Sensitivity)  Result Value Ref Range   Troponin I (High Sensitivity) 12 <18 ng/L   DG Chest 2 View  Result Date: 01/03/2021 CLINICAL DATA:  Left neck and arm pain. EXAM: CHEST - 2 VIEW COMPARISON:  Chest x-ray 10/07/2019. FINDINGS: The heart size and mediastinal contours are within normal limits. Both lungs are clear. The visualized skeletal structures are unremarkable. IMPRESSION: No active cardiopulmonary disease. Electronically Signed   By: Ronney Asters M.D.   On: 01/03/2021 21:42    EKG  EKG Interpretation  Date/Time:  Tuesday January 03 2021 21:26:53 EST Ventricular Rate:  161 PR Interval:    QRS Duration: 76 QT Interval:  272 QTC Calculation: 445 R Axis:   40 Text Interpretation: Atrial fibrillation with rapid ventricular response ST & T wave abnormality, consider inferolateral ischemia Confirmed by Randal Buba, Nova Evett ([id number]) on 01/04/2021 3:42:15 AM         Radiology DG Chest 2 View  Result Date: 01/03/2021 CLINICAL DATA:  Left neck and arm pain. EXAM: CHEST - 2 VIEW COMPARISON:  Chest x-ray 10/07/2019. FINDINGS: The heart size and mediastinal contours are within normal limits. Both lungs are clear. The visualized skeletal structures are unremarkable. IMPRESSION: No active cardiopulmonary disease. Electronically Signed   By: Ronney Asters M.D.   On: 01/03/2021 21:42     Procedures Procedures   Medications Ordered in ED Medications  diltiazem (CARDIZEM) 1 mg/mL load via infusion 10 mg (10 mg Intravenous Bolus from Bag 01/04/21 0027)    And  diltiazem (CARDIZEM) 125 mg in dextrose 5% 125 mL (1 mg/mL) infusion (15 mg/hr Intravenous Rate/Dose Change 01/04/21 0249)  potassium chloride SA (KLOR-CON M) CR tablet 40 mEq (40 mEq Oral Given 01/04/21 0212)  magnesium sulfate IVPB 2 g 50 mL (0 g Intravenous Stopped 01/04/21 0232)  enoxaparin (LOVENOX) injection 75 mg (75 mg Subcutaneous Given 01/04/21 0219)  diltiazem (CARDIZEM) 1 mg/mL load via infusion 10 mg (10 mg Intravenous Bolus from Bag 01/04/21 0300)     ED Course  I have reviewed the triage vital signs and the nursing notes.  Pertinent labs & imaging results that were available during my care of the patient were reviewed by me and considered in my medical decision making (see chart for details).   230 case d/w Dr./ Conley Canal.  Cardiology will round on the patient nothing to do in addition at this time   MDM Reviewed: previous chart and nursing note Interpretation: labs and ECG Total time providing critical care: 30-74 minutes (diltiazem drip and consults). This excludes time spent performing separately reportable procedures and services. Consults: admitting MD and cardiology   CRITICAL CARE Performed by: Kenlyn Lose K Keldan Eplin-Rasch Total critical care time: 60 minutes Critical care time was exclusive of separately billable procedures and treating other patients. Critical care was necessary to treat or prevent imminent or life-threatening deterioration. Critical care was time spent personally by me on the following activities: development of treatment plan with patient and/or surrogate as well as nursing, discussions with consultants, evaluation of patient's response to treatment, examination of patient, obtaining history from patient or surrogate, ordering and performing treatments and interventions,  ordering and review of laboratory studies, ordering and review of radiographic studies, pulse oximetry and re-evaluation  of patient's condition.  Final Clinical Impression(s) / ED Diagnoses Final diagnoses:  Atrial fibrillation with rapid ventricular response ([place])   Admit to medicine  Rx / DC Orders ED Discharge Orders     None        Lytle Malburg, MD 01/04/21 [phone]

## 2021-01-04 NOTE — Plan of Care (Signed)
  Problem: Education: Goal: Knowledge of General Education information will improve Description: Including pain rating scale, medication(s)/side effects and non-pharmacologic comfort measures Outcome: Progressing   Problem: Health Behavior/Discharge Planning: Goal: Ability to manage health-related needs will improve Outcome: Progressing   Problem: Clinical Measurements: Goal: Ability to maintain clinical measurements within normal limits will improve Outcome: Progressing   Problem: Clinical Measurements: Goal: Diagnostic test results will improve Outcome: Progressing   Problem: Clinical Measurements: Goal: Cardiovascular complication will be avoided Outcome: Progressing   Problem: Skin Integrity: Goal: Risk for impaired skin integrity will decrease Outcome: Progressing   Problem: Cardiac: Goal: Ability to achieve and maintain adequate cardiopulmonary perfusion will improve Outcome: Progressing   Problem: Pain Managment: Goal: General experience of comfort will improve Outcome: Progressing

## 2021-01-04 NOTE — Progress Notes (Incomplete)
°  Echocardiogram 2D Echocardiogram has been performed.  Whitney Juarez 01/04/2021, 4:24 PM

## 2021-01-04 NOTE — Consult Note (Addendum)
Cardiology Consultation:   Patient ID: Whitney Juarez MRN: [id number]; DOB: [birth year]  Admit date: 01/03/2021 Date of Consult: 01/04/2021  PCP:  Patient, No Pcp Per (Inactive)   [facility] Providers Cardiologist:  Skeet Latch, MD   {   Patient Profile:   Whitney Juarez is a 55 y.o. female with a hx of PAF, subaortic stenosis with AI, HTN who is being seen 01/04/2021 for the evaluation of afib RVR at the request of Dr. Tamala Julian.  Hx of subaortic membranes with elevated gradient in 2017. TEE 2/218 Mod AI, CCTA with anterior LVOT subaortic membranes), and moderate aortic insufficiency.   Echo 11/2019 showed evidence of increase in LVOT velocities concerning for worsening of her subaortic stenosis. Aortic valve mean gradient measures 43.0 mmHg.  TEE 06/2020 showed LVEF of 60-65%. Few small bubbles seen late consistent with some intrapulmonary shunting. One larger bubble seen late, again, most likely intrapulmonary shunt. No obvious PFO. The aortic valve is trileaflet There is some restricted motion with  systolic doming. The right and noncoronary cusps appear to be more  tethered at their base. Turbulent flow seen through the valve. Unable to  align with jet to get accurate gradients .  Aortic valve regurgitation is mild to moderate.   Seen by Dr. Orvan Seen 07/2020 for surgical consultation. Recommended follow up in 6 months with echo given lack of symptoms.   Seen in ER 08/07/2020 for chest pain and found to have A. fib RVR.  Underwent cardioversion and discharged from ER.  Again seen in ER 10/30/2020 for A. fib RVR and underwent cardioversion.  History of Present Illness:   [name] had sudden onset palpitation with chest pain yesterday.  Felt like throat closing.  Chest pain radiated to to her arm.  She went to ER and found to be in A. fib RVR.  Per ER note, she was out of her medication as she lost her insurance.  However, patient reports she only missed Eliquis  dose for 1 time as it was delay in filling a prescription.  This was about 1 1/2 ago. Patient is given Cardizem load and infusion.  Given K to 40 mEq x 1.  Heart rate now improved to 110s to 120s.  Cardiology is asked for further evaluation.  Patient reports doing relatively well except episode of atrial fibrillation leading to cardioversion in past.  Troponin negative x2.  Creatinine normal.  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   Past Surgical History:  Procedure Laterality Date   BUBBLE STUDY  06/10/2020   Procedure: BUBBLE STUDY;  Surgeon: Fay Records, MD;  Location: [place];  Service: Cardiovascular;;   CESAREAN SECTION     TEE WITHOUT CARDIOVERSION N/A 03/27/2016   Procedure: TRANSESOPHAGEAL ECHOCARDIOGRAM (TEE);  Surgeon: Skeet Latch, MD;  Location: [place];  Service: Cardiovascular;  Laterality: N/A;   TEE WITHOUT CARDIOVERSION N/A 06/10/2020   Procedure: TRANSESOPHAGEAL ECHOCARDIOGRAM (TEE);  Surgeon: Fay Records, MD;  Location: [facility] ENDOSCOPY;  Service: Cardiovascular;  Laterality: N/A;    Inpatient Medications: Scheduled Meds:  apixaban  5 mg Oral BID   Continuous Infusions:  diltiazem (CARDIZEM) infusion 12.5 mg/hr (01/04/21 0923)   PRN Meds: acetaminophen **OR** acetaminophen, albuterol, ondansetron **OR** ondansetron (ZOFRAN) IV  Allergies:   No Known Allergies  Social History:   Social History   Socioeconomic History  Marital status: Married    Spouse name: Not on file   Number of children: Not on file   Years of education: Not on file   Highest education level: Not on file  Occupational History   Not on file  Tobacco Use   Smoking status: Never   Smokeless tobacco: Never  Substance and Sexual Activity   Alcohol use: No   Drug use: No   Sexual activity: Not on file  Other Topics  Concern   Not on file  Social History Narrative   Not on file   Social Determinants of Health   Financial Resource Strain: Not on file  Food Insecurity: Not on file  Transportation Needs: Not on file  Physical Activity: Not on file  Stress: Not on file  Social Connections: Not on file  Intimate Partner Violence: Not on file    Family History:   Family History  Problem Relation Age of Onset   Heart disease Brother      ROS:  Please see the history of present illness.  All other ROS reviewed and negative.     Physical Exam/Data:   Vitals:   01/04/21 0734 01/04/21 1010 01/04/21 1257 01/04/21 1344  BP: (!) 100/59 115/85 105/67 112/68  Pulse: (!) 55 65 (!) 112 100  Resp: 17 16 16 18   Temp:  98.5 F (36.9 C) 99 F (37.2 C) 98.4 F (36.9 C)  TempSrc:  Oral Oral Oral  SpO2: 99% 96% 98% 97%  Weight:  70.6 kg    Height:  4\' 11"  (1.499 m)      Intake/Output Summary (Last 24 hours) at 01/04/2021 1557 Last data filed at 01/04/2021 0232 Gross per 24 hour  Intake 50 ml  Output --  Net 50 ml   Last 3 Weights 01/04/2021 01/03/2021 10/30/2020  Weight (lbs) 155 lb 10.3 oz 164 lb 164 lb  Weight (kg) 70.6 kg 74.39 kg 74.39 kg     Body mass index is 31.44 kg/m.  General:  Well nourished, well developed, in no acute distress HEENT: normal Neck: no JVD Vascular: No carotid bruits; Distal pulses 2+ bilaterally Cardiac:  normal S1, S2; RRR; no murmur  Lungs:  clear to auscultation bilaterally, no wheezing, rhonchi or rales  Abd: soft, nontender, no hepatomegaly  Ext: no edema Musculoskeletal:  No deformities, BUE and BLE strength normal and equal Skin: warm and dry  Neuro:  CNs 2-12 intact, no focal abnormalities noted Psych:  Normal affect   EKG:  The EKG was personally reviewed and demonstrates:  Atrial fibrillation  Telemetry:  Telemetry was personally reviewed and demonstrates:  atrial fibrillation at   Relevant CV Studies:  TEE 06/10/20 1. Small subaortic ridge noted  anteriorly (3 mm). Left ventricular  ejection fraction, by estimation, is 60 to 65%. The left ventricle has  normal function.   2. Right ventricular systolic function is normal. The right ventricular  size is normal.   3. No left atrial/left atrial appendage thrombus was detected.   4. Few small bubbles seen late consistent with some intrapulmonary  shunting. ONe larger bubble seen late, again, most likely intrapulmonary  shunt. No obvious PFO.   5. The mitral valve is normal in structure. Trivial mitral valve  regurgitation.   6. The aortic valve is trileaflet There is some restricted motion with  systolic doming. The right and noncoronary cusps appear to be more  tethered at their base. Turbulent flow seen through the valve. Unable to  align with jet to  get accurate gradients .   Aortic valve regurgitation is mild to moderate.  Echo 11/23/19 1. Left ventricular ejection fraction, by estimation, is 60 to 65%. The  left ventricle has normal function. The left ventricle has no regional  wall motion abnormalities. There is mild left ventricular hypertrophy.  Left ventricular diastolic parameters  are consistent with Grade II diastolic dysfunction (pseudonormalization).   2. Right ventricular systolic function is normal. The right ventricular  size is normal. There is mildly elevated pulmonary artery systolic  pressure. The estimated right ventricular systolic pressure is 84.1 mmHg.   3. The mitral valve is normal in structure. No evidence of mitral valve  regurgitation. No evidence of mitral stenosis.   4. The aortic valve is tricuspid. Aortic valve regurgitation is mild to  moderate. No valvular aortic stenosis is present (valve opens well) but  there is turbulence across the LV outflow tract. I cannot definitively  visualize it, but suspect that there  is a subaortic membrane present creating severe subaortic stenosis. Aortic  valve mean gradient measures 43.0 mmHg. With pulmonary  hypertension  present, would consider surgical evaluation.   5. The inferior vena cava is normal in size with greater than 50%  respiratory variability, suggesting right atrial pressure of 3 mmHg.   Comparison(s): TEE 03/27/16 Mild-moderate AI. Severely elevated AV  gradients with no AS and no subvalvular membrane. TTE 03/05/16 60-65%. PA  pressure 94mmHg. Moderate AI.   Laboratory Data:  High Sensitivity Troponin:   Recent Labs  Lab 01/03/21 2130 01/03/21 2348  TROPONINIHS 8 12     Chemistry Recent Labs  Lab 01/03/21 2130 01/04/21 1050  NA 139 139  K 3.4* 3.7  CL 108 110  CO2 23 22  GLUCOSE 122* 111*  BUN 12 7  CREATININE 0.49 0.50  CALCIUM 8.5* 8.4*  MG  --  2.5*  GFRNONAA >60 >60  ANIONGAP 8 7    Hematology Recent Labs  Lab 01/03/21 2130 01/04/21 1050  WBC 9.3 8.9  RBC 4.16 4.04  HGB 11.0* 10.6*  HCT 33.2* 32.8*  MCV 79.8* 81.2  MCH 26.4 26.2  MCHC 33.1 32.3  RDW 13.6 13.6  PLT 348 343   Thyroid  Recent Labs  Lab 01/04/21 1050  TSH 1.484    Radiology/Studies:  DG Chest 2 View  Result Date: 01/03/2021 CLINICAL DATA:  Left neck and arm pain. EXAM: CHEST - 2 VIEW COMPARISON:  Chest x-ray 10/07/2019. FINDINGS: The heart size and mediastinal contours are within normal limits. Both lungs are clear. The visualized skeletal structures are unremarkable. IMPRESSION: No active cardiopulmonary disease. Electronically Signed   By: Ronney Asters M.D.   On: 01/03/2021 21:42     Assessment and Plan:   Atrial fibrillation with rapid ventricular rate - Prior cardioversion in July and September 2022 in emergency room.  Sudden onset atrial fibrillation started at 7 PM yesterday.  Per EDP note, she ran out off medication however patient reports missing only 1 dose about 7-10 days ago. -Heart rate is currently improving on IV Cardizem -Continue Eliquis -TSH is normal  2.  Subaortic membrane -Seen by Dr. Orvan Seen in June 2022 following TEE.  Recommended follow-up echo  in 6 months given lack of symptoms. -Pending echocardiogram today.   Risk Assessment/Risk Scores:   CHA2DS2-VASc Score = 2  his indicates a 2.2% annual risk of stroke. The patient's score is based upon: CHF History: 0 HTN History: 1 Diabetes History: 0 Stroke History: 0 Vascular Disease History: 0 Age  Score: 0 Gender Score: 1      For questions or updates, please contact [place] Please consult [email or web address] for contact info under    Jarrett Soho, PA  01/04/2021 3:57 PM   Personally seen and examined. Agree with APP above with the following comments: Briefly 55 yo F with PAF, Mild Mod AC, and subaortic membranes hx who presents with AF RVR Patient notes that she has missed a couple of her medications.  She has felt well except for when she has sudden onset on palpitations.   Exam notable for rapid tachycardia and predominantly diastolic murmur Labs notable for TSH WNL,  Personally reviewed relevant tests; Rate controled AF on telemetry in the setting of  Mild/Mod AI with RVR interval variation.  Would recommend  - DOAC as above - I have added PO diltiazem 60 Q6 with goal to down titrate IV or wean off -if this works, we will transition to 240 mg XL diltiazem and DCCV in 30 days - if issues coming off Dilt drip; TEE/DCCV Friday  Rudean Haskell, MD [address] [place], [place] [address] [phone]  6:11 PM

## 2021-01-04 NOTE — ED Notes (Signed)
[place], MD notified of pt HR, new orders received.

## 2021-01-04 NOTE — H&P (Addendum)
History and Physical    Whitney Juarez MHD:[id number] DOB: 1965/05/08 DOA: 01/03/2021  Referring MD/NP/PA:  Kristopher Oppenheim, DO PCP: Whitney Juarez (Inactive)  Patient coming from: Home   Chief Complaint: Heart racing  I have personally briefly reviewed patient's old medical records in [facility]   HPI: Whitney Juarez is a 55 y.o. female with medical history significant of hypertension, atrial fibrillation, and severe aortic stenosis who presents with complaints of heart racing has started yesterday around 7 PM.  Offered interpreter services, but daughter requested to interpret for her mother.  She had been previously set up to have an appointment with Dr. Oval Juarez in October, but was told that she needed to have insurance and therefore did not go.  Patient had lost her insurance and her husband was in the process of trying to get reestablished.  She had recently gotten her medications refilled, but had forgotten to take them the initial 2 days after receiving them.  When symptoms occurred she felt like something was choking her in addition to palpitations and complained of pain in her right arm.  Denied any significant chest pain, shortness of breath, or leg swelling.  ED Course: Upon admission to the emergency department patient was seen to be afebrile, pulse elevated up to 148 in atrial fibrillation, respiration 15-26, blood pressure 100/58 133/68, and O2 saturations maintained on room air.  Labs from 11/29 significant for hemoglobin 11, potassium 3.4, calcium 8.5, and high-sensitivity troponins negative x2.  Chest x-ray showed no acute disease.  Influenza and COVID-19 screening were negative.  Patient had been given 2 g of magnesium sulfate, 40 mEq of potassium chloride, full dose Lovenox, diltiazem 10 mg IV, and started on diltiazem drip.  Review of Systems  Constitutional:  Negative for fever.  HENT:  Negative for ear discharge and nosebleeds.   Eyes:  Negative for  photophobia and pain.  Respiratory:  Negative for cough and shortness of breath.   Cardiovascular:  Positive for palpitations. Negative for leg swelling.  Gastrointestinal:  Negative for abdominal pain, nausea and vomiting.  Genitourinary:  Negative for dysuria and hematuria.  Musculoskeletal:  Positive for myalgias. Negative for falls.  Skin:  Negative for rash.  Neurological:  Negative for focal weakness and loss of consciousness.  Psychiatric/Behavioral:  Negative for substance abuse.    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    Past Surgical History:  Procedure Laterality Date   BUBBLE STUDY  06/10/2020   Procedure: BUBBLE STUDY;  Surgeon: Fay Records, MD;  Location: [place] [facility];  Service: Cardiovascular;;   CESAREAN SECTION     TEE WITHOUT CARDIOVERSION N/A 03/27/2016   Procedure: TRANSESOPHAGEAL ECHOCARDIOGRAM (TEE);  Surgeon: Skeet Latch, MD;  Location: [place];  Service: Cardiovascular;  Laterality: N/A;   TEE WITHOUT CARDIOVERSION N/A 06/10/2020   Procedure: TRANSESOPHAGEAL ECHOCARDIOGRAM (TEE);  Surgeon: Fay Records, MD;  Location[facility] ENDOSCOPY;  Service: Cardiovascular;  Laterality: N/A;     reports that she has never smoked. She has never used smokeless tobacco. She reports that she does not drink alcohol and does not use drugs.  No Known Allergies  Family History  Problem Relation Age of Onset   Heart disease Brother     Prior to Admission medications   Medication Sig Start Date End Date Taking? Authorizing Provider  diltiazem (CARDIZEM  CD) 240 MG 24 hr capsule TAKE 1 CAPSULE BY MOUTH EVERY DAY 12/23/20   Skeet Latch, MD  ELIQUIS 5 MG TABS tablet TAKE 1 TABLET BY MOUTH TWICE A DAY 11/03/20   Chandrasekhar, Mahesh A, MD  ibuprofen (ADVIL,MOTRIN) 600 MG tablet Take 1 tablet (600 mg total) by  mouth every 6 (six) hours as needed. 06/29/16   Drenda Freeze, MD  potassium chloride SA (KLOR-CON) 20 MEQ tablet Take 1 tablet (20 mEq total) by mouth daily. 08/07/20   Molpus, John, MD    Physical Exam:  Constitutional: NAD, calm, comfortable Vitals:   01/04/21 0639 01/04/21 0700 01/04/21 0734 01/04/21 1010  BP:  118/78 (!) 100/59 115/85  Pulse:   (!) 55 65  Resp:  (!) 23 17 16   Temp: 98.5 F (36.9 C)   98.5 F (36.9 C)  TempSrc: Oral   Oral  SpO2:  95% 99% 96%  Weight:    70.6 kg  Height:    4\' 11"  (1.499 m)   Eyes: PERRL, lids and conjunctivae normal ENMT: Mucous membranes are moist. Posterior pharynx clear of any exudate or lesions.Normal dentition.  Neck: normal, supple, no masses, no thyromegaly Respiratory: clear to auscultation bilaterally, no wheezing, no crackles. Normal respiratory effort. No accessory muscle use.  Cardiovascular: Regular rate and rhythm, no murmurs / rubs / gallops. No extremity edema. 2+ pedal pulses. No carotid bruits.  Abdomen: no tenderness, no masses palpated. No hepatosplenomegaly. Bowel sounds positive.  Musculoskeletal: no clubbing / cyanosis. No joint deformity upper and lower extremities. Good ROM, no contractures. Normal muscle tone.  Skin: no rashes, lesions, ulcers. No induration Neurologic: CN 2-12 grossly intact. Sensation intact, DTR normal. Strength 5/5 in all 4.  Psychiatric: Normal judgment and insight. Alert and oriented x 3. Normal mood.     Labs on Admission: I have personally reviewed following labs and imaging studies  CBC: Recent Labs  Lab 01/03/21 2130  WBC 9.3  HGB 11.0*  HCT 33.2*  MCV 79.8*  PLT 650   Basic Metabolic Panel: Recent Labs  Lab 01/03/21 2130  NA 139  K 3.4*  CL 108  CO2 23  GLUCOSE 122*  BUN 12  CREATININE 0.49  CALCIUM 8.5*   GFR: Estimated Creatinine Clearance: 69.6 mL/min (by C-G formula based on SCr of 0.49 mg/dL). Liver Function Tests: No results for input(s): AST, ALT, ALKPHOS,  BILITOT, PROT, ALBUMIN in the last 168 hours. No results for input(s): LIPASE, AMYLASE in the last 168 hours. No results for input(s): AMMONIA in the last 168 hours. Coagulation Profile: No results for input(s): INR, PROTIME in the last 168 hours. Cardiac Enzymes: No results for input(s): CKTOTAL, CKMB, CKMBINDEX, TROPONINI in the last 168 hours. BNP (last 3 results) No results for input(s): PROBNP in the last 8760 hours. HbA1C: No results for input(s): HGBA1C in the last 72 hours. CBG: No results for input(s): GLUCAP in the last 168 hours. Lipid Profile: No results for input(s): CHOL, HDL, LDLCALC, TRIG, CHOLHDL, LDLDIRECT in the last 72 hours. Thyroid Function Tests: No results for input(s): TSH, T4TOTAL, FREET4, T3FREE, THYROIDAB in the last 72 hours. Anemia Panel: No results for input(s): VITAMINB12, FOLATE, FERRITIN, TIBC, IRON, RETICCTPCT in the last 72 hours. Urine analysis:    Component Value Date/Time   COLORURINE YELLOW 05/27/2015 0936   APPEARANCEUR CLEAR 05/27/2015 0936   LABSPEC 1.006 05/27/2015 0936   PHURINE 7.0 05/27/2015 0936   GLUCOSEU NEGATIVE 05/27/2015 0936   HGBUR SMALL (A) 05/27/2015 3546  BILIRUBINUR NEGATIVE 05/27/2015 0936   KETONESUR NEGATIVE 05/27/2015 0936   PROTEINUR NEGATIVE 05/27/2015 0936   NITRITE NEGATIVE 05/27/2015 0936   LEUKOCYTESUR NEGATIVE 05/27/2015 0936   Sepsis Labs: Recent Results (from the past 240 hour(s))  Resp Panel by RT-PCR (Flu A&B, Covid) Nasopharyngeal Swab     Status: None   Collection Time: 01/04/21 12:38 AM   Specimen: Nasopharyngeal Swab; Nasopharyngeal(NP) swabs in vial transport medium  Result Value Ref Range Status   SARS Coronavirus 2 by RT PCR NEGATIVE NEGATIVE Final    Comment: (NOTE) SARS-CoV-2 target nucleic acids are NOT DETECTED.  The SARS-CoV-2 RNA is generally detectable in upper respiratory specimens during the acute phase of infection. The lowest concentration of SARS-CoV-2 viral copies this assay  can detect is 138 copies/mL. A negative result does not preclude SARS-Cov-2 infection and should not be used as the sole basis for treatment or other patient management decisions. A negative result may occur with  improper specimen collection/handling, submission of specimen other than nasopharyngeal swab, presence of viral mutation(s) within the areas targeted by this assay, and inadequate number of viral copies(<138 copies/mL). A negative result must be combined with clinical observations, patient history, and epidemiological information. The expected result is Negative.  Fact Sheet for Patients:  EntrepreneurPulse.com.au  Fact Sheet for Healthcare Providers:  IncredibleEmployment.be  This test is no t yet approved or cleared by the [place] FDA and  has been authorized for detection and/or diagnosis of SARS-CoV-2 by FDA under an Emergency Use Authorization (EUA). This EUA will remain  in effect (meaning this test can be used) for the duration of the COVID-19 declaration under Section 564(b)(1) of the Act, 21 U.S.C.section 360bbb-3(b)(1), unless the authorization is terminated  or revoked sooner.       Influenza A by PCR NEGATIVE NEGATIVE Final   Influenza B by PCR NEGATIVE NEGATIVE Final    Comment: (NOTE) The Xpert Xpress SARS-CoV-2/FLU/RSV plus assay is intended as an aid in the diagnosis of influenza from Nasopharyngeal swab specimens and should not be used as a sole basis for treatment. Nasal washings and aspirates are unacceptable for Xpert Xpress SARS-CoV-2/FLU/RSV testing.  Fact Sheet for Patients: EntrepreneurPulse.com.au  Fact Sheet for Healthcare Providers: IncredibleEmployment.be  This test is not yet approved or cleared by the [place] FDA and has been authorized for detection and/or diagnosis of SARS-CoV-2 by FDA under an Emergency Use Authorization (EUA). This EUA will  remain in effect (meaning this test can be used) for the duration of the COVID-19 declaration under Section 564(b)(1) of the Act, 21 U.S.C. section 360bbb-3(b)(1), unless the authorization is terminated or revoked.  Performed at [facility], [address] [place], [place] [address]      Radiological Exams on Admission: DG Chest 2 View  Result Date: 01/03/2021 CLINICAL DATA:  Left neck and arm pain. EXAM: CHEST - 2 VIEW COMPARISON:  Chest x-ray 10/07/2019. FINDINGS: The heart size and mediastinal contours are within normal limits. Both lungs are clear. The visualized skeletal structures are unremarkable. IMPRESSION: No active cardiopulmonary disease. Electronically Signed   By: Ronney Asters M.D.   On: 01/03/2021 21:42    EKG: Independently reviewed.  Atrial fibrillation with RVR at 161 bpm  Assessment/Plan Atrial fibrillation with RVR: Acute.  Patient has known history of atrial fibrillation, but presents with complaints of heart racing and choking feeling.  Found to be in A. fib with RVR 161 bpm. CHA2DS2-VASc score =2.  Patient had just recently refilled her  medications of Eliquis and diltiazem, but had missed taking it for 2 days. -Admit to a progressive bed -Continue Cardizem drip -Check TSH -Goal of at least potassium 4 and magnesium 2.  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 -Follow-up repeat echocardiogram  Hypokalemia: Resolved.  Initial potassium noted to be 3.4 on 11/29.  Given potassium chloride 40 mEq p.o. x1 dose.  Repeat check later today 3.7. -Give potassium chloride 30 mEq x 1 dose  Normocytic anemia: Hemoglobin 11-> 10.6 g/dL.  No reports of bleeding. -Continue to monitor  DVT prophylaxis: Eliquis Code Status: Full Family Communication: Daughter updated at bedside Disposition Plan:  Hopefully discharge home tomorrow morning Consults called: Cardiology Admission status: Observation  Norval Morton MD [facility]   If 7PM-7AM, please contact night-coverage   01/04/2021, 10:23 AM

## 2021-01-04 NOTE — Progress Notes (Signed)
  [facility] will assume care on arrival to accepting facility. Until arrival, care as per EDP. However, [facility] available 24/7 for questions and assistance.   Nursing staff please page [facility] Admits and Consults ([phone]) as soon as the patient arrives to the hospital.  Jayvion Stefanski, DO [facility]  

## 2021-01-04 NOTE — Progress Notes (Signed)
  X-cover Note: Multiple attempts to contact EDP that requested [facility] hospitalist consult. EDP busy with other patients.   Kristopher Oppenheim, DO [facility]

## 2021-01-05 LAB — MAGNESIUM: Magnesium: 2.2 mg/dL (ref 1.7–2.4)

## 2021-01-05 LAB — CBC
HCT: 31.9 % — ABNORMAL LOW (ref 36.0–46.0)
Hemoglobin: 10.5 g/dL — ABNORMAL LOW (ref 12.0–15.0)
MCH: 26.7 pg (ref 26.0–34.0)
MCHC: 32.9 g/dL (ref 30.0–36.0)
MCV: 81.2 fL (ref 80.0–100.0)
Platelets: 317 10*3/uL (ref 150–400)
RBC: 3.93 MIL/uL (ref 3.87–5.11)
RDW: 13.5 % (ref 11.5–15.5)
WBC: 6.6 10*3/uL (ref 4.0–10.5)
nRBC: 0 % (ref 0.0–0.2)

## 2021-01-05 LAB — BASIC METABOLIC PANEL
Anion gap: 7 (ref 5–15)
BUN: 11 mg/dL (ref 6–20)
CO2: 23 mmol/L (ref 22–32)
Calcium: 8.8 mg/dL — ABNORMAL LOW (ref 8.9–10.3)
Chloride: 108 mmol/L (ref 98–111)
Creatinine, Ser: 0.55 mg/dL (ref 0.44–1.00)
GFR, Estimated: >60 mL/min (ref >60)
Glucose, Bld: 104 mg/dL — ABNORMAL HIGH (ref 70–99)
Potassium: 3.8 mmol/L (ref 3.5–5.1)
Sodium: 138 mmol/L (ref 135–145)

## 2021-01-05 LAB — PHOSPHORUS: Phosphorus: 3.6 mg/dL (ref 2.5–4.6)

## 2021-01-05 MED ORDER — DILTIAZEM HCL ER COATED BEADS 240 MG PO CP24
240.0000 mg | ORAL_CAPSULE | Freq: Every day | ORAL | Status: DC
  Administered 2021-01-05: 240 mg via ORAL
  Filled 2021-01-05: qty 1

## 2021-01-05 NOTE — Progress Notes (Signed)
Progress Note  Patient Name: Whitney Juarez Date of Encounter: 01/05/2021  Primary Cardiologist: Skeet Latch, MD   Subjective   In interval converted to SR.  No CP, SOB, and DOE.  Ready to go home (she hates hospitals).  Inpatient Medications    Scheduled Meds:  apixaban  5 mg Oral BID   diltiazem  240 mg Oral Daily   Continuous Infusions:  PRN Meds: acetaminophen **OR** acetaminophen, albuterol, ondansetron **OR** ondansetron (ZOFRAN) IV   Vital Signs    Vitals:   01/04/21 2005 01/04/21 2309 01/05/21 0417 01/05/21 0815  BP: (!) 103/59 111/64 116/62 112/66  Pulse: 79 67 74 71  Resp: 19 19 17 18   Temp: 99 F (37.2 C) 98.3 F (36.8 C) 98.3 F (36.8 C) 98.3 F (36.8 C)  TempSrc: Oral Oral Oral Oral  SpO2: 100% 98% 99% 97%  Weight:      Height:        Intake/Output Summary (Last 24 hours) at 01/05/2021 1031 Last data filed at 01/04/2021 2100 Gross per 24 hour  Intake 562.57 ml  Output --  Net 562.57 ml   Filed Weights   01/03/21 2121 01/04/21 1010  Weight: 74.4 kg 70.6 kg    Telemetry    AFIB-> SR - Personally Reviewed  ECG    No new - Personally Reviewed  Physical Exam   Gen: No distress   Neck: No JVD Cardiac: No Rubs or Gallops, prominent diastolic Murmur, normal rate +2 radial pulses Respiratory: Clear to auscultation bilaterally, normal effort, normal  respiratory rate GI: Soft, nontender, non-distended  MS: No  edema;  moves all extremities Integument: Skin feels warm Neuro:  At time of evaluation, alert and oriented to person/place/time/situation  Psych: Normal affect, patient feels well   Labs    Chemistry Recent Labs  Lab 01/03/21 2130 01/04/21 1050 01/05/21 0254  NA 139 139 138  K 3.4* 3.7 3.8  CL 108 110 108  CO2 23 22 23   GLUCOSE 122* 111* 104*  BUN 12 7 11   CREATININE 0.49 0.50 0.55  CALCIUM 8.5* 8.4* 8.8*  GFRNONAA >60 >60 >60  ANIONGAP 8 7 7      Hematology Recent Labs  Lab 01/03/21 2130  01/04/21 1050 01/05/21 0254  WBC 9.3 8.9 6.6  RBC 4.16 4.04 3.93  HGB 11.0* 10.6* 10.5*  HCT 33.2* 32.8* 31.9*  MCV 79.8* 81.2 81.2  MCH 26.4 26.2 26.7  MCHC 33.1 32.3 32.9  RDW 13.6 13.6 13.5  PLT 348 343 317    Cardiac EnzymesNo results for input(s): TROPONINI in the last 168 hours. No results for input(s): TROPIPOC in the last 168 hours.   BNPNo results for input(s): BNP, PROBNP in the last 168 hours.   DDimer No results for input(s): DDIMER in the last 168 hours.   Radiology    DG Chest 2 View  Result Date: 01/03/2021 CLINICAL DATA:  Left neck and arm pain. EXAM: CHEST - 2 VIEW COMPARISON:  Chest x-ray 10/07/2019. FINDINGS: The heart size and mediastinal contours are within normal limits. Both lungs are clear. The visualized skeletal structures are unremarkable. IMPRESSION: No active cardiopulmonary disease. Electronically Signed   By: Ronney Asters M.D.   On: 01/03/2021 21:42   ECHOCARDIOGRAM COMPLETE  Result Date: 01/04/2021    ECHOCARDIOGRAM REPORT   Patient Name:   Whitney Juarez [facility] Date of Exam: 01/04/2021 Medical Rec #:  [id number]              Height:  59.0 in Accession #:    [id number]             Weight:       155.6 lb Date of Birth:  07-27-1965               BSA:          1.658 m Patient Age:    55 years               BP:           112/68 mmHg Patient Gender: F                      HR:           100 bpm. Exam Location:  Inpatient Procedure: 2D Echo, Cardiac Doppler and Color Doppler Indications:    I48.91* Unspeicified atrial fibrillation  History:        Patient has prior history of Echocardiogram examinations, most                 recent 01/05/2020. Aortic Valve Disease, Arrythmias:Atrial                 Fibrillation; Risk Factors:Hypertension. Aortic insufficiency.  Sonographer:    Philipp Deputy RDCS Referring Phys: [id number] [place]  1. Left ventricular ejection fraction, by estimation, is 60 to 65%. The left ventricle has normal function.  The left ventricle has no regional wall motion abnormalities. There is mild concentric left ventricular hypertrophy. Left ventricular diastolic parameters are indeterminate.  2. Right ventricular systolic function is normal. The right ventricular size is normal.  3. The mitral valve is normal in structure. No evidence of mitral valve regurgitation. No evidence of mitral stenosis.  4. The aortic valve is calcified. There is moderate calcification of the aortic valve. There is moderate thickening of the aortic valve. Aortic valve regurgitation is mild to moderate, with PHT at 402 msec. Aortic valve sclerosis/calcification is present, without any evidence of aortic stenosis and the valve opens well. The peak velocity and mean gradient is increased suggesting turbulence across and the LVOT. No appreciation of a subaortic membrane. Aortic valve mean gradient measures 29.2 mmHg.  Aortic valve Vmax measures 3.40 m/s.  5. The inferior vena cava is normal in size with greater than 50% respiratory variability, suggesting right atrial pressure of 3 mmHg. Comparison(s): TEE 06/10/2020 with no evidence of subaortic membrane. But noted restriction of motion with systolic doming. FINDINGS  Left Ventricle: Left ventricular ejection fraction, by estimation, is 60 to 65%. The left ventricle has normal function. The left ventricle has no regional wall motion abnormalities. The left ventricular internal cavity size was normal in size. There is  mild concentric left ventricular hypertrophy. Left ventricular diastolic parameters are indeterminate. Right Ventricle: The right ventricular size is normal. No increase in right ventricular wall thickness. Right ventricular systolic function is normal. Left Atrium: Left atrial size was normal in size. Right Atrium: Right atrial size was normal in size. Pericardium: There is no evidence of pericardial effusion. Mitral Valve: The mitral valve is normal in structure. No evidence of mitral valve  regurgitation. No evidence of mitral valve stenosis. Tricuspid Valve: The tricuspid valve is normal in structure. Tricuspid valve regurgitation is not demonstrated. No evidence of tricuspid stenosis. Aortic Valve: The aortic valve is calcified. There is moderate calcification of the aortic valve. There is moderate thickening of the aortic valve. There is moderate aortic valve annular calcification. Aortic valve  regurgitation is mild to moderate. Aortic regurgitation PHT measures 402 msec. Aortic valve sclerosis/calcification is present, without any evidence of aortic stenosis. Aortic valve mean gradient measures 29.2 mmHg. Aortic valve peak gradient measures 46.2 mmHg. Pulmonic Valve: The pulmonic valve was normal in structure. Pulmonic valve regurgitation is not visualized. No evidence of pulmonic stenosis. Aorta: The aortic root is normal in size and structure. Venous: The inferior vena cava is normal in size with greater than 50% respiratory variability, suggesting right atrial pressure of 3 mmHg. IAS/Shunts: No atrial level shunt detected by color flow Doppler.  LEFT VENTRICLE PLAX 2D LVIDd:         4.60 cm LVIDs:         3.10 cm LV PW:         1.10 cm LV IVS:        1.20 cm  RIGHT VENTRICLE RV Basal diam:  2.70 cm RV S prime:     9.68 cm/s TAPSE (M-mode): 2.7 cm LEFT ATRIUM           Index LA diam:      3.50 cm 2.11 cm/m LA Vol (A2C): 55.8 ml 33.66 ml/m LA Vol (A4C): 42.9 ml 25.88 ml/m  AORTIC VALVE AV Vmax:      340.00 cm/s AV Vmean:     255.500 cm/s AV VTI:       0.629 m AV Peak Grad: 46.2 mmHg AV Mean Grad: 29.2 mmHg AI PHT:       402 msec  AORTA Ao Root diam: 3.10 cm MITRAL VALVE                TRICUSPID VALVE MV Area (PHT): 2.43 cm     TR Peak grad:   29.4 mmHg MV Decel Time: 312 msec     TR Vmax:        271.00 cm/s MV E velocity: 101.57 cm/s Kardie Tobb DO Electronically signed by Berniece Salines DO Signature Date/Time: 01/04/2021/6:53:18 PM    Final     Cardiac Studies   Normal EF Mild to Mod AI,  Increased LVOT gradient but no subaortic membranes noted   Patient Profile     55 y.o. female AI and PAF off medications who is back on her DOAC and back in SR  Assessment & Plan    PAF CHADVASC 2 (HTN and gender) Mixed valve disease with AI, AS and hx of subaortic stenosis - Eliquis has returned - back on diltiazem - will need AF clinic f/u; if more frequent RVR episodes while on medication may need AAD   [facility] will sign off.   Medication Recommendations:  Eliquis and DOAC Other recommendations (labs, testing, etc):  Move back upcoming echo by 2 months Follow up as an outpatient:  needs AF clinic f/u  For questions or updates, please contact [facility] Please consult [email or web address] for contact info under Cardiology/STEMI.      Signed, Werner Lean, MD  01/05/2021, 10:31 AM

## 2021-01-05 NOTE — Care Management (Signed)
01-05-21 1150 Case Manager spoke with patient and daughter at the bedside that speaks [place]. Patient is without a PCP and insurance at this time. Case Manager spoke with patient and she would like a provider in [facility]. Case Manager called the [facility] that accepts patients without insurance. An eligibility appointment has been scheduled and placed on the AVS. The [facility] [employer] has a pharmacy onsite and once established the patient will be able to utilize the pharmacy. Case Manager will follow closely for [facility] assistance for this patient.

## 2021-01-05 NOTE — Discharge Summary (Signed)
Physician Discharge Summary  Maytte Jacot Pineda-Avila IBB:[id number] DOB: 1965/06/13 DOA: 01/03/2021  PCP: Patient, No Pcp Per (Inactive)  Admit date: 01/03/2021 Discharge date: 01/05/2021  Admitted From: Home Disposition: Home  Recommendations for Outpatient Follow-up:  Follow up with PCP in 1-2 weeks Follow up with Cardiology within 1-2 weeks  Please obtain CMP/CBC, Mag, Phos in one week Please follow up on the following pending results:  Home Health: No Equipment/Devices: None  Discharge Condition: Stable  CODE STATUS: FULL CODE Diet recommendation: Heart healthy diet  Brief/Interim Summary: HPI per Dr. Fuller Plan on 01/04/21  Whitney Juarez is a 55 y.o. female with medical history significant of hypertension, atrial fibrillation, and severe aortic stenosis who presents with complaints of heart racing has started yesterday around 7 PM.  Offered interpreter services, but daughter requested to interpret for her mother.  She had been previously set up to have an appointment with Dr. Oval Linsey in October, but was told that she needed to have insurance and therefore did not go.  Patient had lost her insurance and her husband was in the process of trying to get reestablished.  She had recently gotten her medications refilled, but had forgotten to take them the initial 2 days after receiving them.  When symptoms occurred she felt like something was choking her in addition to palpitations and complained of pain in her right arm.  Denied any significant chest pain, shortness of breath, or leg swelling.   ED Course: Upon admission to the emergency department patient was seen to be afebrile, pulse elevated up to 148 in atrial fibrillation, respiration 15-26, blood pressure 100/58 133/68, and O2 saturations maintained on room air.  Labs from 11/29 significant for hemoglobin 11, potassium 3.4, calcium 8.5, and high-sensitivity troponins negative x2.  Chest x-ray showed no acute disease.   Influenza and COVID-19 screening were negative.  Patient had been given 2 g of magnesium sulfate, 40 mEq of potassium chloride, full dose Lovenox, diltiazem 10 mg IV, and started on diltiazem drip.  **Interim History She was placed on a Cardizem drip and spontaneously converted in the interim.  Cardiology had been consulted and recommended continuing her current home medications at discharge and will follow the patient outpatient setting given that the patient's symptoms have improved significantly   Discharge Diagnoses:  Principal Problem:   Rapid atrial fibrillation (HCC) Active Problems:   Subaortic stenosis   Normocytic anemia   Hypokalemia  Atrial fibrillation with RVR -Patient has known history of atrial fibrillation, but presents with complaints of heart racing and choking feeling.  Found to be in A. fib with RVR 161 bpm. CHA2DS2-VASc score =2.  Patient had just recently refilled her medications of Eliquis and diltiazem, but had missed taking it for 2 days. -Admitted to a progressive bed as an observation -Continue Cardizem drip and spontaneously converted to cardiology recommended continuing her home Cardizem CD -Check TSH and was 1.484 -Goal of at least potassium 4 and magnesium 2.  Replace as needed -Check echocardiogram and showed "Left ventricular ejection fraction, by estimation, is 60 to 65%. The left ventricle has normal function. The left ventricle has no regional  wall motion abnormalities. There is mild concentric left ventricular  hypertrophy. Left ventricular diastolic  parameters are indeterminate." -Continue Eliquis -Appreciate cardiology consultative services  will follow-up for further recommendations -Since cardiology been consulted she converted to normal sinus rhythm spontaneously and was placed to p.o. Cardizem; cardiology recommended continue home Cardizem medications Eliquis discharge   Subaortic membrane:  -Seen  in June 2022 following TEE. -Follow-up repeat  echocardiogram showed "The aortic valve is calcified. There is moderate calcification of the aortic valve. There is moderate thickening of the aortic valve. Aortic valve regurgitation is mild to moderate, with PHT at 402 msec. Aortic valve  sclerosis/calcification is present, without any evidence of aortic stenosis and the valve opens well. The peak velocity and mean gradient is increased suggesting turbulence across and the LVOT. No appreciation of a subaortic membrane. Aortic valve mean gradient measures 29.2 mmHg. Aortic valve Vmax measures 3.40 m/s." -Follow-up with cardiology in outpatient setting   Hypokalemia:  -Resolved.  Initial potassium noted to be 3.4 on 11/29.  Given potassium chloride 40 mEq p.o. x1 dose.  Repeat check later today 3.7. -Give potassium chloride 30 mEq x 1 dose yesterday and repeat this morning was 3.8 -Mag level was 2.2 and normal -Continue to monitor and trend and repeat in outpatient setting   Normocytic anemia:  -Hemoglobin 11-> 10.6 g/dL and trended down to 10.5/31.9.  No reports of bleeding. -Outpatient anemia panel -Continue to monitor for signs and symptoms of bleeding; currently no overt bleeding noted -Repeat CBC within 1 week  Obesity -Complicates overall prognosis and care -Estimated body mass index is 31.44 kg/m as calculated from the following:   Height as of this encounter: 4\' 11"  (1.499 m).   Weight as of this encounter: 70.6 kg. -Weight Loss and Dietary Counseling given    Discharge Instructions  Discharge Instructions     Amb referral to [facility] Clinic   Complete by: As directed    Call MD for:  difficulty breathing, headache or visual disturbances   Complete by: As directed    Call MD for:  extreme fatigue   Complete by: As directed    Call MD for:  hives   Complete by: As directed    Call MD for:  persistant dizziness or light-headedness   Complete by: As directed    Call MD for:  persistant nausea and vomiting   Complete by: As  directed    Call MD for:  redness, tenderness, or signs of infection (pain, swelling, redness, odor or green/yellow discharge around incision site)   Complete by: As directed    Call MD for:  severe uncontrolled pain   Complete by: As directed    Call MD for:  temperature >100.4   Complete by: As directed    Diet - low sodium heart healthy   Complete by: As directed    Discharge instructions   Complete by: As directed    You were cared for by a hospitalist during your hospital stay. If you have any questions about your discharge medications or the care you received while you were in the hospital after you are discharged, you can call the unit and ask to speak with the hospitalist on call if the hospitalist that took care of you is not available. Once you are discharged, your primary care physician will handle any further medical issues. Please note that NO REFILLS for any discharge medications will be authorized once you are discharged, as it is imperative that you return to your primary care physician (or establish a relationship with a primary care physician if you do not have one) for your aftercare needs so that they can reassess your need for medications and monitor your lab values.  Follow up with PCP and Cardiology within 1-2 weeks. Take all medications as prescribed. If symptoms change or worsen please return to the ED  for evaluation   Increase activity slowly   Complete by: As directed       Allergies as of 01/05/2021   No Known Allergies      Medication List     STOP taking these medications    ibuprofen 200 MG tablet Commonly known as: ADVIL   potassium chloride SA 20 MEQ tablet Commonly known as: KLOR-CON M       TAKE these medications    diltiazem 240 MG 24 hr capsule Commonly known as: CARDIZEM CD TAKE 1 CAPSULE BY MOUTH EVERY DAY What changed:  how much to take when to take this   Eliquis 5 MG Tabs tablet Generic drug: apixaban TAKE 1 TABLET BY MOUTH  TWICE A DAY What changed: how much to take        [facility] [place] Follow up on 01/09/2021.   Why: @ 1:45 pm for eligibility appointment for establishment for primary care provider. Please bring photo ID, Proof of income- full month of pay stubs, Social [employer], and current 1040 taxes. If you miss this appointment a fee of $10.00 will be applied. Thanks Contact information: [address] [place] [address] [phone]               No Known Allergies  Consultations: Cardiology  Procedures/Studies: DG Chest 2 View  Result Date: 01/03/2021 CLINICAL DATA:  Left neck and arm pain. EXAM: CHEST - 2 VIEW COMPARISON:  Chest x-ray 10/07/2019. FINDINGS: The heart size and mediastinal contours are within normal limits. Both lungs are clear. The visualized skeletal structures are unremarkable. IMPRESSION: No active cardiopulmonary disease. Electronically Signed   By: Ronney Asters M.D.   On: 01/03/2021 21:42   ECHOCARDIOGRAM COMPLETE  Result Date: 01/04/2021    ECHOCARDIOGRAM REPORT   Patient Name:   Christeen MARIA [facility] Date of Exam: 01/04/2021 Medical Rec #:  [id number]              Height:       59.0 in Accession #:    [id number]             Weight:       155.6 lb Date of Birth:  16-Jun-1965               BSA:          1.658 m Patient Age:    54 years               BP:           112/68 mmHg Patient Gender: F                      HR:           100 bpm. Exam Location:  Inpatient Procedure: 2D Echo, Cardiac Doppler and Color Doppler Indications:    I48.91* Unspeicified atrial fibrillation  History:        Patient has prior history of Echocardiogram examinations, most                 recent 01/05/2020. Aortic Valve Disease, Arrythmias:Atrial                 Fibrillation; Risk Factors:Hypertension. Aortic insufficiency.  Sonographer:    Philipp Deputy RDCS Referring Phys: [id number] [place]  1. Left ventricular ejection  fraction, by estimation, is 60 to 65%. The left ventricle has normal function. The left ventricle  has no regional wall motion abnormalities. There is mild concentric left ventricular hypertrophy. Left ventricular diastolic parameters are indeterminate.  2. Right ventricular systolic function is normal. The right ventricular size is normal.  3. The mitral valve is normal in structure. No evidence of mitral valve regurgitation. No evidence of mitral stenosis.  4. The aortic valve is calcified. There is moderate calcification of the aortic valve. There is moderate thickening of the aortic valve. Aortic valve regurgitation is mild to moderate, with PHT at 402 msec. Aortic valve sclerosis/calcification is present, without any evidence of aortic stenosis and the valve opens well. The peak velocity and mean gradient is increased suggesting turbulence across and the LVOT. No appreciation of a subaortic membrane. Aortic valve mean gradient measures 29.2 mmHg.  Aortic valve Vmax measures 3.40 m/s.  5. The inferior vena cava is normal in size with greater than 50% respiratory variability, suggesting right atrial pressure of 3 mmHg. Comparison(s): TEE 06/10/2020 with no evidence of subaortic membrane. But noted restriction of motion with systolic doming. FINDINGS  Left Ventricle: Left ventricular ejection fraction, by estimation, is 60 to 65%. The left ventricle has normal function. The left ventricle has no regional wall motion abnormalities. The left ventricular internal cavity size was normal in size. There is  mild concentric left ventricular hypertrophy. Left ventricular diastolic parameters are indeterminate. Right Ventricle: The right ventricular size is normal. No increase in right ventricular wall thickness. Right ventricular systolic function is normal. Left Atrium: Left atrial size was normal in size. Right Atrium: Right atrial size was normal in size. Pericardium: There is no evidence of pericardial effusion. Mitral  Valve: The mitral valve is normal in structure. No evidence of mitral valve regurgitation. No evidence of mitral valve stenosis. Tricuspid Valve: The tricuspid valve is normal in structure. Tricuspid valve regurgitation is not demonstrated. No evidence of tricuspid stenosis. Aortic Valve: The aortic valve is calcified. There is moderate calcification of the aortic valve. There is moderate thickening of the aortic valve. There is moderate aortic valve annular calcification. Aortic valve regurgitation is mild to moderate. Aortic regurgitation PHT measures 402 msec. Aortic valve sclerosis/calcification is present, without any evidence of aortic stenosis. Aortic valve mean gradient measures 29.2 mmHg. Aortic valve peak gradient measures 46.2 mmHg. Pulmonic Valve: The pulmonic valve was normal in structure. Pulmonic valve regurgitation is not visualized. No evidence of pulmonic stenosis. Aorta: The aortic root is normal in size and structure. Venous: The inferior vena cava is normal in size with greater than 50% respiratory variability, suggesting right atrial pressure of 3 mmHg. IAS/Shunts: No atrial level shunt detected by color flow Doppler.  LEFT VENTRICLE PLAX 2D LVIDd:         4.60 cm LVIDs:         3.10 cm LV PW:         1.10 cm LV IVS:        1.20 cm  RIGHT VENTRICLE RV Basal diam:  2.70 cm RV S prime:     9.68 cm/s TAPSE (M-mode): 2.7 cm LEFT ATRIUM           Index LA diam:      3.50 cm 2.11 cm/m LA Vol (A2C): 55.8 ml 33.66 ml/m LA Vol (A4C): 42.9 ml 25.88 ml/m  AORTIC VALVE AV Vmax:      340.00 cm/s AV Vmean:     255.500 cm/s AV VTI:       0.629 m AV Peak Grad: 46.2 mmHg AV Mean Grad: 29.2 mmHg  AI PHT:       402 msec  AORTA Ao Root diam: 3.10 cm MITRAL VALVE                TRICUSPID VALVE MV Area (PHT): 2.43 cm     TR Peak grad:   29.4 mmHg MV Decel Time: 312 msec     TR Vmax:        271.00 cm/s MV E velocity: 101.57 cm/s Kardie Tobb DO Electronically signed by Berniece Salines DO Signature Date/Time:  01/04/2021/6:53:18 PM    Final      Subjective: Seen and examined at the bedside and her daughter is in the room will translate and patient felt well and that her baseline.  She denied complaints and no chest pain or shortness of breath.  No lightheadedness or dizziness and she had spontaneously converted normal sinus rhythm.  No other concerns at this time and cardiology recommended discharge and follow-up in outpatient setting with A. fib clinic.  Discharge Exam: Vitals:   01/05/21 0815 01/05/21 1059  BP: 112/66 133/72  Pulse: 71 69  Resp: 18 19  Temp: 98.3 F (36.8 C) 98.5 F (36.9 C)  SpO2: 97% 100%   Vitals:   01/04/21 2309 01/05/21 0417 01/05/21 0815 01/05/21 1059  BP: 111/64 116/62 112/66 133/72  Pulse: 67 74 71 69  Resp: 19 17 18 19   Temp: 98.3 F (36.8 C) 98.3 F (36.8 C) 98.3 F (36.8 C) 98.5 F (36.9 C)  TempSrc: Oral Oral Oral Oral  SpO2: 98% 99% 97% 100%  Weight:      Height:       General: Pt is alert, awake, not in acute distress Cardiovascular: RRR, S1/S2 +, no rubs, no gallops; has a 970 systolic murmur Respiratory: Slightly diminished bilaterally, no wheezing, no rhonchi Abdominal: Soft, NT, distended secondary habitus, bowel sounds + Extremities: Trace edema, no cyanosis  The results of significant diagnostics from this hospitalization (including imaging, microbiology, ancillary and laboratory) are listed below for reference.    Microbiology: Recent Results (from the past 240 hour(s))  Resp Panel by RT-PCR (Flu A&B, Covid) Nasopharyngeal Swab     Status: None   Collection Time: 01/04/21 12:38 AM   Specimen: Nasopharyngeal Swab; Nasopharyngeal(NP) swabs in vial transport medium  Result Value Ref Range Status   SARS Coronavirus 2 by RT PCR NEGATIVE NEGATIVE Final    Comment: (NOTE) SARS-CoV-2 target nucleic acids are NOT DETECTED.  The SARS-CoV-2 RNA is generally detectable in upper respiratory specimens during the acute phase of infection. The  lowest concentration of SARS-CoV-2 viral copies this assay can detect is 138 copies/mL. A negative result does not preclude SARS-Cov-2 infection and should not be used as the sole basis for treatment or other patient management decisions. A negative result may occur with  improper specimen collection/handling, submission of specimen other than nasopharyngeal swab, presence of viral mutation(s) within the areas targeted by this assay, and inadequate number of viral copies(<138 copies/mL). A negative result must be combined with clinical observations, patient history, and epidemiological information. The expected result is Negative.  Fact Sheet for Patients:  EntrepreneurPulse.com.au  Fact Sheet for Healthcare Providers:  IncredibleEmployment.be  This test is no t yet approved or cleared by the [place] FDA and  has been authorized for detection and/or diagnosis of SARS-CoV-2 by FDA under an Emergency Use Authorization (EUA). This EUA will remain  in effect (meaning this test can be used) for the duration of the COVID-19 declaration under Section 564(b)(1)  of the Act, 21 U.S.C.section 360bbb-3(b)(1), unless the authorization is terminated  or revoked sooner.       Influenza A by PCR NEGATIVE NEGATIVE Final   Influenza B by PCR NEGATIVE NEGATIVE Final    Comment: (NOTE) The Xpert Xpress SARS-CoV-2/FLU/RSV plus assay is intended as an aid in the diagnosis of influenza from Nasopharyngeal swab specimens and should not be used as a sole basis for treatment. Nasal washings and aspirates are unacceptable for Xpert Xpress SARS-CoV-2/FLU/RSV testing.  Fact Sheet for Patients: EntrepreneurPulse.com.au  Fact Sheet for Healthcare Providers: IncredibleEmployment.be  This test is not yet approved or cleared by the [place] FDA and has been authorized for detection and/or diagnosis of SARS-CoV-2 by FDA under  an Emergency Use Authorization (EUA). This EUA will remain in effect (meaning this test can be used) for the duration of the COVID-19 declaration under Section 564(b)(1) of the Act, 21 U.S.C. section 360bbb-3(b)(1), unless the authorization is terminated or revoked.  Performed at [facility], [address] [place], [place] [address]     Labs: BNP (last 3 results) No results for input(s): BNP in the last 8760 hours. Basic Metabolic Panel: Recent Labs  Lab 01/03/21 2130 01/04/21 1050 01/05/21 0254  NA 139 139 138  K 3.4* 3.7 3.8  CL 108 110 108  CO2 23 22 23   GLUCOSE 122* 111* 104*  BUN 12 7 11   CREATININE 0.49 0.50 0.55  CALCIUM 8.5* 8.4* 8.8*  MG  --  2.5* 2.2  PHOS  --   --  3.6   Liver Function Tests: No results for input(s): AST, ALT, ALKPHOS, BILITOT, PROT, ALBUMIN in the last 168 hours. No results for input(s): LIPASE, AMYLASE in the last 168 hours. No results for input(s): AMMONIA in the last 168 hours. CBC: Recent Labs  Lab 01/03/21 2130 01/04/21 1050 01/05/21 0254  WBC 9.3 8.9 6.6  NEUTROABS  --  6.8  --   HGB 11.0* 10.6* 10.5*  HCT 33.2* 32.8* 31.9*  MCV 79.8* 81.2 81.2  PLT 348 343 317   Cardiac Enzymes: No results for input(s): CKTOTAL, CKMB, CKMBINDEX, TROPONINI in the last 168 hours. BNP: Invalid input(s): POCBNP CBG: No results for input(s): GLUCAP in the last 168 hours. D-Dimer No results for input(s): DDIMER in the last 72 hours. Hgb A1c No results for input(s): HGBA1C in the last 72 hours. Lipid Profile No results for input(s): CHOL, HDL, LDLCALC, TRIG, CHOLHDL, LDLDIRECT in the last 72 hours. Thyroid function studies Recent Labs    01/04/21 1050  TSH 1.484   Anemia work up No results for input(s): VITAMINB12, FOLATE, FERRITIN, TIBC, IRON, RETICCTPCT in the last 72 hours. Urinalysis    Component Value Date/Time   COLORURINE YELLOW 05/27/2015 0936   APPEARANCEUR CLEAR 05/27/2015 0936   LABSPEC 1.006 05/27/2015 0936    PHURINE 7.0 05/27/2015 0936   GLUCOSEU NEGATIVE 05/27/2015 0936   HGBUR SMALL (A) 05/27/2015 0936   BILIRUBINUR NEGATIVE 05/27/2015 0936   KETONESUR NEGATIVE 05/27/2015 0936   PROTEINUR NEGATIVE 05/27/2015 0936   NITRITE NEGATIVE 05/27/2015 0936   LEUKOCYTESUR NEGATIVE 05/27/2015 0936   Sepsis Labs Invalid input(s): PROCALCITONIN,  WBC,  LACTICIDVEN Microbiology Recent Results (from the past 240 hour(s))  Resp Panel by RT-PCR (Flu A&B, Covid) Nasopharyngeal Swab     Status: None   Collection Time: 01/04/21 12:38 AM   Specimen: Nasopharyngeal Swab; Nasopharyngeal(NP) swabs in vial transport medium  Result Value Ref Range Status   SARS Coronavirus 2 by  RT PCR NEGATIVE NEGATIVE Final    Comment: (NOTE) SARS-CoV-2 target nucleic acids are NOT DETECTED.  The SARS-CoV-2 RNA is generally detectable in upper respiratory specimens during the acute phase of infection. The lowest concentration of SARS-CoV-2 viral copies this assay can detect is 138 copies/mL. A negative result does not preclude SARS-Cov-2 infection and should not be used as the sole basis for treatment or other patient management decisions. A negative result may occur with  improper specimen collection/handling, submission of specimen other than nasopharyngeal swab, presence of viral mutation(s) within the areas targeted by this assay, and inadequate number of viral copies(<138 copies/mL). A negative result must be combined with clinical observations, patient history, and epidemiological information. The expected result is Negative.  Fact Sheet for Patients:  EntrepreneurPulse.com.au  Fact Sheet for Healthcare Providers:  IncredibleEmployment.be  This test is no t yet approved or cleared by the [place] FDA and  has been authorized for detection and/or diagnosis of SARS-CoV-2 by FDA under an Emergency Use Authorization (EUA). This EUA will remain  in effect (meaning this test  can be used) for the duration of the COVID-19 declaration under Section 564(b)(1) of the Act, 21 U.S.C.section 360bbb-3(b)(1), unless the authorization is terminated  or revoked sooner.       Influenza A by PCR NEGATIVE NEGATIVE Final   Influenza B by PCR NEGATIVE NEGATIVE Final    Comment: (NOTE) The Xpert Xpress SARS-CoV-2/FLU/RSV plus assay is intended as an aid in the diagnosis of influenza from Nasopharyngeal swab specimens and should not be used as a sole basis for treatment. Nasal washings and aspirates are unacceptable for Xpert Xpress SARS-CoV-2/FLU/RSV testing.  Fact Sheet for Patients: EntrepreneurPulse.com.au  Fact Sheet for Healthcare Providers: IncredibleEmployment.be  This test is not yet approved or cleared by the [place] FDA and has been authorized for detection and/or diagnosis of SARS-CoV-2 by FDA under an Emergency Use Authorization (EUA). This EUA will remain in effect (meaning this test can be used) for the duration of the COVID-19 declaration under Section 564(b)(1) of the Act, 21 U.S.C. section 360bbb-3(b)(1), unless the authorization is terminated or revoked.  Performed at [facility], [address] [place], [place] [address]    Time coordinating discharge: 35 minutes  SIGNED:  Kerney Elbe, DO [facility] 01/05/2021, 8:00 PM Pager is on AMION  If 7PM-7AM, please contact night-coverage [email or web address]

## 2021-01-06 LAB — CALCIUM, IONIZED: Calcium, Ionized, Serum: 5.1 mg/dL (ref 4.5–5.6)

## 2021-01-16 ENCOUNTER — Inpatient Hospital Stay (HOSPITAL_COMMUNITY): Admit: 2021-01-16 | Payer: Self-pay | Admitting: Physician Assistant

## 2021-01-16 NOTE — Progress Notes (Incomplete)
Primary Care Physician: Patient, No Pcp Per (Inactive) Primary Cardiologist: Dr Oval Linsey Primary Electrophysiologist: *** Referring Physician: Wilhemena Durie Whitney Juarez is a 55 y.o. female with a history of HTN, atrial fibrillation who presents for consultation in the [facility].  The patient was initially diagnosed with atrial fibrillation *** after presenting to *** with symptoms of ***. Patient is on *** for a CHADS2VASC score of ***.  Today, she denies symptoms of ***palpitations, chest pain, shortness of breath, orthopnea, PND, lower extremity edema, dizziness, presyncope, syncope, snoring, daytime somnolence, bleeding, or neurologic sequela. The patient is tolerating medications without difficulties and is otherwise without complaint today.    Atrial Fibrillation Risk Factors:  she {Action; does/does not:19097} have symptoms or diagnosis of sleep apnea. she {ACTION; IS/IS [id number]} compliant with CPAP therapy. she {Action; does/does not:19097} have a history of rheumatic fever. she {Action; does/does not:19097} have a history of alcohol use. The patient {Action; does/does not:19097} have a history of early familial atrial fibrillation or other arrhythmias.  she has a BMI of There is no height or weight on file to calculate BMI.. There were no vitals filed for this visit.  Family History  Problem Relation Age of Onset   Heart disease Brother      Atrial Fibrillation Management history:  Previous antiarrhythmic drugs: *** Previous cardioversions: *** Previous ablations: *** CHADS2VASC score: *** Anticoagulation history: ***   Past Medical History:  Diagnosis Date   Aortic regurgitation 04/20/2016   Atrial fibrillation (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   Past Surgical History:  Procedure Laterality Date   BUBBLE STUDY  06/10/2020   Procedure: BUBBLE STUDY;   Surgeon: Fay Records, MD;  Location: [place];  Service: Cardiovascular;;   CESAREAN SECTION     TEE WITHOUT CARDIOVERSION N/A 03/27/2016   Procedure: TRANSESOPHAGEAL ECHOCARDIOGRAM (TEE);  Surgeon: Skeet Latch, MD;  Location: [place];  Service: Cardiovascular;  Laterality: N/A;   TEE WITHOUT CARDIOVERSION N/A 06/10/2020   Procedure: TRANSESOPHAGEAL ECHOCARDIOGRAM (TEE);  Surgeon: Fay Records, MD;  Location: [facility] ENDOSCOPY;  Service: Cardiovascular;  Laterality: N/A;    Current Outpatient Medications  Medication Sig Dispense Refill   diltiazem (CARDIZEM CD) 240 MG 24 hr capsule TAKE 1 CAPSULE BY MOUTH EVERY DAY (Patient taking differently: Take 240 mg by mouth every evening.) 90 capsule 2   ELIQUIS 5 MG TABS tablet TAKE 1 TABLET BY MOUTH TWICE A DAY (Patient taking differently: Take 5 mg by mouth 2 (two) times daily.) 60 tablet 5   No current facility-administered medications for this visit.    No Known Allergies  Social History   Socioeconomic History   Marital status: Married    Spouse name: Not on file   Number of children: Not on file   Years of education: Not on file   Highest education level: Not on file  Occupational History   Not on file  Tobacco Use   Smoking status: Never   Smokeless tobacco: Never  Substance and Sexual Activity   Alcohol use: No   Drug use: No   Sexual activity: Not on file  Other Topics Concern   Not on file  Social History Narrative   Not on file   Social Determinants of Health   Financial Resource Strain: Not on file  Food Insecurity: Not on file  Transportation Needs: Not on file  Physical Activity: Not on file  Stress:  Not on file  Social Connections: Not on file  Intimate Partner Violence: Not on file     ROS- All systems are reviewed and negative except as per the HPI above.  Physical Exam: There were no vitals filed for this visit.  GEN- The patient is a well appearing ***{Desc; female/female:11659}, alert and  oriented x 3 today.   Head- normocephalic, atraumatic Eyes-  Sclera clear, conjunctiva pink Ears- hearing intact Oropharynx- clear Neck- supple  Lungs- Clear to ausculation bilaterally, normal work of breathing Heart- ***Regular rate and rhythm, no murmurs, rubs or gallops  GI- soft, NT, ND, + BS Extremities- no clubbing, cyanosis, or edema MS- no significant deformity or atrophy Skin- no rash or lesion Psych- euthymic mood, full affect Neuro- strength and sensation are intact  Wt Readings from Last 3 Encounters:  01/04/21 70.6 kg  10/30/20 74.4 kg  08/06/20 74.8 kg    EKG today demonstrates ***  Echo *** demonstrated ***  Epic records are reviewed at length today  CHA2DS2-VASc Score = 2  The patient's score is based upon: CHF History: 0 HTN History: 1 Diabetes History: 0 Stroke History: 0 Vascular Disease History: 0 Age Score: 0 Gender Score: 1   {Confirm score is correct.  If not, click here to update score.  REFRESH note.  :1}    ASSESSMENT AND PLAN: {Select the correct AFib Diagnosis                 :[id number]}   SignedOliver Barre, PA    01/16/2021 8:40 AM    3. Obesity There is no height or weight on file to calculate BMI. Lifestyle modification was discussed at length including regular exercise and weight reduction. ***  4. Snoring***Obstructive sleep apnea The importance of adequate treatment of sleep apnea was discussed today in order to improve our ability to maintain sinus rhythm long term. ***  5. ***   Follow up ***   Adline Peals PA-C [facility] [address] [place], [place] [address] [phone] 01/16/2021 8:40 AM

## 2021-01-17 ENCOUNTER — Other Ambulatory Visit (HOSPITAL_COMMUNITY): Payer: Self-pay

## 2021-01-18 ENCOUNTER — Encounter (HOSPITAL_COMMUNITY): Payer: Self-pay | Admitting: Physician Assistant

## 2021-01-18 ENCOUNTER — Other Ambulatory Visit: Payer: Self-pay

## 2021-01-18 ENCOUNTER — Ambulatory Visit (HOSPITAL_COMMUNITY)
Admission: RE | Admit: 2021-01-18 | Discharge: 2021-01-18 | Disposition: A | Discharge status: Home / Self Care | Payer: Self-pay | Source: Ambulatory Visit | Attending: Physician Assistant | Admitting: Physician Assistant

## 2021-01-18 VITALS — BP 140/90 | HR 75 | Ht 59.0 in | Wt 161.4 lb

## 2021-01-18 DIAGNOSIS — E669 Obesity, unspecified: Secondary | ICD-10-CM | POA: Insufficient documentation

## 2021-01-18 DIAGNOSIS — Z6832 Body mass index (BMI) 32.0-32.9, adult: Secondary | ICD-10-CM | POA: Insufficient documentation

## 2021-01-18 DIAGNOSIS — I480 Paroxysmal atrial fibrillation: Secondary | ICD-10-CM | POA: Insufficient documentation

## 2021-01-18 DIAGNOSIS — I10 Essential (primary) hypertension: Secondary | ICD-10-CM | POA: Insufficient documentation

## 2021-01-18 DIAGNOSIS — Q244 Congenital subaortic stenosis: Secondary | ICD-10-CM | POA: Insufficient documentation

## 2021-01-18 DIAGNOSIS — Z7901 Long term (current) use of anticoagulants: Secondary | ICD-10-CM | POA: Insufficient documentation

## 2021-01-18 LAB — CBC
HCT: 31.8 % — ABNORMAL LOW (ref 36.0–46.0)
Hemoglobin: 10 g/dL — ABNORMAL LOW (ref 12.0–15.0)
MCH: 25.2 pg — ABNORMAL LOW (ref 26.0–34.0)
MCHC: 31.4 g/dL (ref 30.0–36.0)
MCV: 80.1 fL (ref 80.0–100.0)
Platelets: 271 10*3/uL (ref 150–400)
RBC: 3.97 MIL/uL (ref 3.87–5.11)
RDW: 13.3 % (ref 11.5–15.5)
WBC: 7 10*3/uL (ref 4.0–10.5)
nRBC: 0 % (ref 0.0–0.2)

## 2021-01-18 LAB — COMPREHENSIVE METABOLIC PANEL
ALT: 21 U/L (ref 0–44)
AST: 20 U/L (ref 15–41)
Albumin: 3.5 g/dL (ref 3.5–5.0)
Alkaline Phosphatase: 65 U/L (ref 38–126)
Anion gap: 8 (ref 5–15)
BUN: 10 mg/dL (ref 6–20)
CO2: 24 mmol/L (ref 22–32)
Calcium: 8.5 mg/dL — ABNORMAL LOW (ref 8.9–10.3)
Chloride: 103 mmol/L (ref 98–111)
Creatinine, Ser: 0.64 mg/dL (ref 0.44–1.00)
GFR, Estimated: >60 mL/min (ref >60)
Glucose, Bld: 116 mg/dL — ABNORMAL HIGH (ref 70–99)
Potassium: 3.6 mmol/L (ref 3.5–5.1)
Sodium: 135 mmol/L (ref 135–145)
Total Bilirubin: 0.6 mg/dL (ref 0.3–1.2)
Total Protein: 6.8 g/dL (ref 6.5–8.1)

## 2021-01-18 LAB — MAGNESIUM: Magnesium: 2.2 mg/dL (ref 1.7–2.4)

## 2021-01-18 LAB — PHOSPHORUS: Phosphorus: 3.3 mg/dL (ref 2.5–4.6)

## 2021-01-18 MED ORDER — DILTIAZEM HCL ER COATED BEADS 300 MG PO CP24: 300.0000 mg | ORAL_CAPSULE | Freq: Every day | ORAL | 3 refills | Status: DC

## 2021-01-18 MED ORDER — APIXABAN 5 MG PO TABS: 5.0000 mg | ORAL_TABLET | Freq: Two times a day (BID) | ORAL | 5 refills | Status: DC

## 2021-01-18 NOTE — Patient Instructions (Signed)
Increase cardizem to 300mg  a day

## 2021-01-18 NOTE — Progress Notes (Signed)
Primary Care Physician: Patient, No Pcp Per (Inactive) Primary Cardiologist: Dr Oval Linsey Primary Electrophysiologist: none Referring Physician: Dr Adah Salvage Whitney Juarez is a 55 y.o. female with a history of HTN, subaortic stenosis, AI, atrial fibrillation who presents for consultation in the [facility]. Seen by Roderic Palau NP remotely. Patient presented to the ED 01/04/21 with sudden onset palpitations and chest pain. ECG showed afib with RVR. She was started on IV diltiazem and converted to SR. She reports that she had ran out of her medications. She has had episodes of afib in the past with missed doses of diltiazem. Patient is on Eliquis for a CHADS2VASC score of 2. She reports that she is feeling well today. She denies alcohol use or significant snoring. An interpreter was used for today's visit.   Today, she denies symptoms of palpitations, chest pain, shortness of breath, orthopnea, PND, lower extremity edema, dizziness, presyncope, syncope, snoring, daytime somnolence, bleeding, or neurologic sequela. The patient is tolerating medications without difficulties and is otherwise without complaint today.    Atrial Fibrillation Risk Factors:  she does not have symptoms or diagnosis of sleep apnea. she does not have a history of rheumatic fever. she does not have a history of alcohol use. The patient does have a history of early familial atrial fibrillation or other arrhythmias. Father and brother have afib.  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/2016   Past Surgical History:  Procedure Laterality Date   BUBBLE STUDY  06/10/2020   Procedure: BUBBLE STUDY;  Surgeon: Fay Records, MD;  Location: [place];  Service: Cardiovascular;;   CESAREAN SECTION     TEE WITHOUT CARDIOVERSION N/A 03/27/2016   Procedure: TRANSESOPHAGEAL ECHOCARDIOGRAM (TEE);  Surgeon: Skeet Latch, MD;  Location: [place];  Service: Cardiovascular;  Laterality: N/A;   TEE WITHOUT CARDIOVERSION N/A 06/10/2020   Procedure: TRANSESOPHAGEAL ECHOCARDIOGRAM (TEE);  Surgeon: Fay Records, MD;  Location: [facility] ENDOSCOPY;  Service: Cardiovascular;  Laterality: N/A;    Current Outpatient Medications  Medication Sig Dispense Refill   diltiazem (CARDIZEM CD) 240 MG 24 hr capsule TAKE 1 CAPSULE BY MOUTH EVERY DAY 90 capsule 2   ELIQUIS 5 MG TABS tablet TAKE 1 TABLET BY MOUTH TWICE A DAY 60 tablet 5   ibuprofen (ADVIL) 400 MG tablet Take 400 mg by mouth every 6 (six) hours as needed for mild pain or moderate pain.     No current facility-administered medications for this encounter.    No Known Allergies  Social History   Socioeconomic History   Marital status: Married    Spouse name: Not on file   Number of children: Not on file   Years of education: Not on file   Highest education level: Not on file  Occupational History   Not on file  Tobacco Use   Smoking status: Never   Smokeless tobacco: Never  Substance and Sexual Activity   Alcohol use: No  Drug use: No   Sexual activity: Not on file  Other Topics Concern   Not on file  Social History Narrative   Not on file   Social Determinants of Health   Financial Resource Strain: Not on file  Food Insecurity: Not on file  Transportation Needs: Not on file  Physical Activity: Not on file  Stress: Not on file  Social Connections: Not on file  Intimate  Partner Violence: Not on file     ROS- All systems are reviewed and negative except as per the HPI above.  Physical Exam: Vitals:   01/18/21 1049  BP: 140/90  Pulse: 75  Weight: 73.2 kg  Height: 4\' 11"  (1.499 m)    GEN- The patient is a well appearing obese female, alert and oriented x 3 today.   Head- normocephalic, atraumatic Eyes-  Sclera clear, conjunctiva pink Ears- hearing intact Oropharynx- clear Neck- supple  Lungs- Clear to ausculation bilaterally, normal work of breathing Heart- Regular rate and rhythm, no rubs or gallops, 3/6 diastolic murmur  GI- soft, NT, ND, + BS Extremities- no clubbing, cyanosis, or edema MS- no significant deformity or atrophy Skin- no rash or lesion Psych- euthymic mood, full affect Neuro- strength and sensation are intact  Wt Readings from Last 3 Encounters:  01/18/21 73.2 kg  01/04/21 70.6 kg  10/30/20 74.4 kg    EKG today demonstrates  SR, NST Vent. rate 75 BPM PR interval 144 ms QRS duration 78 ms QT/QTcB 384/428 ms  Echo 01/04/21 demonstrated   1. Left ventricular ejection fraction, by estimation, is 60 to 65%. The  left ventricle has normal function. The left ventricle has no regional  wall motion abnormalities. There is mild concentric left ventricular  hypertrophy. Left ventricular diastolic parameters are indeterminate.   2. Right ventricular systolic function is normal. The right ventricular  size is normal.   3. The mitral valve is normal in structure. No evidence of mitral valve regurgitation. No evidence of mitral stenosis.   4. The aortic valve is calcified. There is moderate calcification of the  aortic valve. There is moderate thickening of the aortic valve. Aortic  valve regurgitation is mild to moderate, with PHT at 402 msec. Aortic  valve sclerosis/calcification is present, without any evidence of aortic stenosis and the valve opens well. The peak velocity and mean gradient is increased suggesting turbulence  across and the LVOT. No appreciation of a subaortic membrane. Aortic valve mean gradient measures 29.2 mmHg.   Aortic valve Vmax measures 3.40 m/s.   5. The inferior vena cava is normal in size with greater than 50%  respiratory variability, suggesting right atrial pressure of 3 mmHg.   Comparison(s): TEE 06/10/2020 with no evidence of subaortic membrane. But noted restriction of motion with systolic doming.   Epic records are reviewed at length today  CHA2DS2-VASc Score = 2  The patient's score is based upon: CHF History: 0 HTN History: 1 Diabetes History: 0 Stroke History: 0 Vascular Disease History: 0 Age Score: 0 Gender Score: 1       ASSESSMENT AND PLAN: 1. Paroxysmal Atrial Fibrillation (ICD10:  I48.0) The patient's CHA2DS2-VASc score is 2, indicating a 2.2% annual risk of stroke.   Patient back in SR on her medications.  Continue Eliquis 5 mg BID, samples given today. Patient already working with community health in [place] for medication/insurance coverage.  Increase diltiazem to 300 mg daily  2. Obesity Body mass index is 32.6 kg/m. Lifestyle modification was discussed at length  including regular exercise and weight reduction.  3. Valvular heart disease Subaortic stenosis with AI Followed by Dr Oval Linsey   Follow up in the AF clinic in 2 months. She is also overdue for follow up with Dr Oval Linsey.   [facility] [address] [place], [place] [address] [phone] 01/18/2021 11:02 AM

## 2021-01-25 ENCOUNTER — Other Ambulatory Visit (HOSPITAL_COMMUNITY): Payer: Self-pay

## 2021-01-25 MED ORDER — APIXABAN 5 MG PO TABS: 5.0000 mg | ORAL_TABLET | Freq: Two times a day (BID) | ORAL | 0 refills | Status: DC

## 2021-02-24 ENCOUNTER — Telehealth (HOSPITAL_COMMUNITY): Payer: Self-pay | Admitting: *Deleted

## 2021-02-24 MED ORDER — DILTIAZEM HCL ER COATED BEADS 300 MG PO CP24: 300.0000 mg | ORAL_CAPSULE | Freq: Every day | ORAL | 3 refills | Status: DC

## 2021-02-24 NOTE — Telephone Encounter (Signed)
Pts daughter left VM requesting a refill. Pts daughter did not state the name of the medication but said It was a medication that was recently increased. Pt is not followed by the heart failure clinic. Pt was seen in the afib clinic 01/18/21. Per Clint Fenton.PA last office not cardizem was increased.   Routed to [facility] with the afib clinic

## 2021-02-24 NOTE — Telephone Encounter (Signed)
Rx sent 

## 2021-03-12 ENCOUNTER — Emergency Department (HOSPITAL_COMMUNITY): Payer: BC Managed Care – PPO

## 2021-03-12 ENCOUNTER — Observation Stay (HOSPITAL_COMMUNITY)
Admission: EM | Admit: 2021-03-12 | Discharge: 2021-03-13 | Disposition: A | Discharge status: Home / Self Care | Payer: BC Managed Care – PPO | Attending: Internal Medicine | Admitting: Internal Medicine

## 2021-03-12 ENCOUNTER — Encounter (HOSPITAL_COMMUNITY): Payer: Self-pay | Admitting: Emergency Medicine

## 2021-03-12 DIAGNOSIS — I4891 Unspecified atrial fibrillation: Secondary | ICD-10-CM

## 2021-03-12 DIAGNOSIS — Z79899 Other long term (current) drug therapy: Secondary | ICD-10-CM | POA: Diagnosis not present

## 2021-03-12 DIAGNOSIS — Z20822 Contact with and (suspected) exposure to covid-19: Secondary | ICD-10-CM | POA: Diagnosis not present

## 2021-03-12 DIAGNOSIS — I480 Paroxysmal atrial fibrillation: Principal | ICD-10-CM | POA: Insufficient documentation

## 2021-03-12 DIAGNOSIS — Z7901 Long term (current) use of anticoagulants: Secondary | ICD-10-CM | POA: Insufficient documentation

## 2021-03-12 DIAGNOSIS — I10 Essential (primary) hypertension: Secondary | ICD-10-CM | POA: Insufficient documentation

## 2021-03-12 DIAGNOSIS — R079 Chest pain, unspecified: Secondary | ICD-10-CM | POA: Diagnosis not present

## 2021-03-12 DIAGNOSIS — D509 Iron deficiency anemia, unspecified: Secondary | ICD-10-CM | POA: Insufficient documentation

## 2021-03-12 LAB — CBC WITH DIFFERENTIAL/PLATELET
Abs Immature Granulocytes: 0.03 10*3/uL (ref 0.00–0.07)
Basophils Absolute: 0.1 10*3/uL (ref 0.0–0.1)
Basophils Relative: 1 %
Eosinophils Absolute: 0.2 10*3/uL (ref 0.0–0.5)
Eosinophils Relative: 2 %
HCT: 38.2 % (ref 36.0–46.0)
Hemoglobin: 11.6 g/dL — ABNORMAL LOW (ref 12.0–15.0)
Immature Granulocytes: 0 %
Lymphocytes Relative: 28 %
Lymphs Abs: 2.7 10*3/uL (ref 0.7–4.0)
MCH: 22.7 pg — ABNORMAL LOW (ref 26.0–34.0)
MCHC: 30.4 g/dL (ref 30.0–36.0)
MCV: 74.9 fL — ABNORMAL LOW (ref 80.0–100.0)
Monocytes Absolute: 0.7 10*3/uL (ref 0.1–1.0)
Monocytes Relative: 7 %
Neutro Abs: 6.2 10*3/uL (ref 1.7–7.7)
Neutrophils Relative %: 62 %
Platelets: 288 10*3/uL (ref 150–400)
RBC: 5.1 MIL/uL (ref 3.87–5.11)
RDW: 15.6 % — ABNORMAL HIGH (ref 11.5–15.5)
WBC: 9.9 10*3/uL (ref 4.0–10.5)
nRBC: 0 % (ref 0.0–0.2)

## 2021-03-12 LAB — RESP PANEL BY RT-PCR (FLU A&B, COVID) ARPGX2
Influenza A by PCR: NEGATIVE
Influenza B by PCR: NEGATIVE
SARS Coronavirus 2 by RT PCR: NEGATIVE

## 2021-03-12 LAB — BASIC METABOLIC PANEL
Anion gap: 13 (ref 5–15)
BUN: 10 mg/dL (ref 6–20)
CO2: 22 mmol/L (ref 22–32)
Calcium: 8.8 mg/dL — ABNORMAL LOW (ref 8.9–10.3)
Chloride: 103 mmol/L (ref 98–111)
Creatinine, Ser: 0.57 mg/dL (ref 0.44–1.00)
GFR, Estimated: >60 mL/min (ref >60)
Glucose, Bld: 144 mg/dL — ABNORMAL HIGH (ref 70–99)
Potassium: 3.4 mmol/L — ABNORMAL LOW (ref 3.5–5.1)
Sodium: 138 mmol/L (ref 135–145)

## 2021-03-12 LAB — TROPONIN I (HIGH SENSITIVITY)
Troponin I (High Sensitivity): 6 ng/L (ref <18)
Troponin I (High Sensitivity): 10 ng/L (ref <18)

## 2021-03-12 LAB — MAGNESIUM: Magnesium: 2 mg/dL (ref 1.7–2.4)

## 2021-03-12 LAB — BRAIN NATRIURETIC PEPTIDE: B Natriuretic Peptide: 59.1 pg/mL (ref 0.0–100.0)

## 2021-03-12 LAB — TSH: TSH: 1.051 u[IU]/mL (ref 0.350–4.500)

## 2021-03-12 MED ORDER — METOPROLOL TARTRATE 25 MG PO TABS
25.0000 mg | ORAL_TABLET | Freq: Two times a day (BID) | ORAL | Status: DC
  Administered 2021-03-12: 25 mg via ORAL
  Filled 2021-03-12 (×2): qty 1

## 2021-03-12 MED ORDER — DILTIAZEM HCL 25 MG/5ML IV SOLN: 10.0000 mg | Freq: Once | INTRAVENOUS | Status: DC

## 2021-03-12 MED ORDER — APIXABAN 5 MG PO TABS
5.0000 mg | ORAL_TABLET | Freq: Once | ORAL | Status: AC
  Administered 2021-03-12: 5 mg via ORAL
  Filled 2021-03-12: qty 1

## 2021-03-12 MED ORDER — IBUPROFEN 200 MG PO TABS
400.0000 mg | ORAL_TABLET | Freq: Once | ORAL | Status: DC
  Filled 2021-03-12: qty 2

## 2021-03-12 MED ORDER — DILTIAZEM HCL-DEXTROSE 125-5 MG/125ML-% IV SOLN (PREMIX)
5.0000 mg/h | INTRAVENOUS | Status: DC
  Administered 2021-03-12: 5 mg/h via INTRAVENOUS
  Filled 2021-03-12: qty 125

## 2021-03-12 MED ORDER — SODIUM CHLORIDE 0.9 % IV BOLUS
500.0000 mL | Freq: Once | INTRAVENOUS | Status: AC
  Administered 2021-03-12: 500 mL via INTRAVENOUS

## 2021-03-12 MED ORDER — DILTIAZEM HCL 25 MG/5ML IV SOLN
15.0000 mg | Freq: Once | INTRAVENOUS | Status: AC
  Administered 2021-03-12: 15 mg via INTRAVENOUS
  Filled 2021-03-12: qty 5

## 2021-03-12 MED ORDER — POTASSIUM CHLORIDE CRYS ER 20 MEQ PO TBCR
40.0000 meq | EXTENDED_RELEASE_TABLET | Freq: Once | ORAL | Status: AC
  Administered 2021-03-12: 40 meq via ORAL
  Filled 2021-03-12: qty 2

## 2021-03-12 MED ORDER — APIXABAN 5 MG PO TABS: 5.0000 mg | ORAL_TABLET | Freq: Two times a day (BID) | ORAL | Status: DC

## 2021-03-12 MED ORDER — IBUPROFEN 200 MG PO TABS: 400.0000 mg | ORAL_TABLET | Freq: Once | ORAL | Status: DC | PRN

## 2021-03-12 MED ORDER — ACETAMINOPHEN 325 MG PO TABS: 650.0000 mg | ORAL_TABLET | ORAL | Status: DC | PRN

## 2021-03-12 MED ORDER — APIXABAN 5 MG PO TABS
5.0000 mg | ORAL_TABLET | Freq: Two times a day (BID) | ORAL | Status: DC
  Administered 2021-03-12 – 2021-03-13 (×2): 5 mg via ORAL
  Filled 2021-03-12 (×2): qty 1

## 2021-03-12 MED ORDER — ONDANSETRON HCL 4 MG/2ML IJ SOLN: 4.0000 mg | Freq: Four times a day (QID) | INTRAMUSCULAR | Status: DC | PRN

## 2021-03-12 NOTE — ED Triage Notes (Signed)
Pt endorses left arm pain since 7 am. Also having mild chest pain. Pt afib RVR on monitor. Denies SOB or dizziness. Pt states she has not taken morning medications yet.

## 2021-03-12 NOTE — Plan of Care (Signed)
°  Problem: Education: Goal: Knowledge of disease or condition will improve Outcome: Progressing   Problem: Activity: Goal: Ability to tolerate increased activity will improve Outcome: Progressing   Problem: Cardiac: Goal: Ability to achieve and maintain adequate cardiopulmonary perfusion will improve Outcome: Progressing

## 2021-03-12 NOTE — H&P (Addendum)
Date: 03/12/2021               Patient Name:  Whitney Juarez MRN: [id number]  DOB: Mar 04, 1965 Age / Sex: 56 y.o., female   PCP: Patient, No Pcp Per (Inactive)         Medical Service: Internal Medicine Teaching Service         Attending Physician: Dr. Lottie Mussel, MD    First Contact: Dr. Marlou Sa Pager: [phone]  Second Contact: Dr. Laural Golden Pager: [phone]       After Hours (After 5p/  First Contact Pager: [phone]  weekends / holidays): Second Contact Pager: [phone]   Chief Complaint: Arm and chest pain  History of Present Illness: Ms. Whitney Juarez is a 56 year old female with a past medical history of paroxysmal atrial fibrillation presenting with left-sided arm and chest pain.  History was obtained via the assistance of a Spanish interpreter via [employer].  Patient reports that she woke up with left-sided arm pain that lasted for about 2 hours.  She describes an arm soreness.  She also had a tingling sensation in her chest which was short lasting.  She denies any further or more chest pain.  Denies any associated symptoms of nausea, vomiting, diaphoresis, jaw pain, abdominal pain, shortness of breath, dizziness or lightheadedness.  She states she has been in her usual state of health but did feel slightly more fatigued for the past day.  She does endorse bright red blood that she occasionally notes in her stool and when she wipes.  Denies any history of hemorrhoids and has not had a colonoscopy.  She has been taking her medications as prescribed.  Denies any recent sick contacts or illnesses.  Denies any recent travel.  Meds:  Current Meds  Medication Sig   apixaban (ELIQUIS) 5 MG TABS tablet Take 1 tablet (5 mg total) by mouth 2 (two) times daily.   diltiazem (CARDIZEM CD) 300 MG 24 hr capsule Take 1 capsule (300 mg total) by mouth daily.   ibuprofen (ADVIL) 400 MG tablet Take 400 mg by mouth every 6 (six) hours as needed for mild pain or moderate pain.      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  States that she seldomly drinks alcohol.  Review of Systems: A complete ROS was negative except as per HPI.   Physical Exam: Blood pressure 125/76, pulse 74, temperature 98.1 F (36.7 C), temperature source Oral, resp. rate 18, height 4\' 11"  (1.499 m), weight 69.4 kg, SpO2 99 %. Physical Exam General: alert, appears stated age, in no acute distress HEENT: Normocephalic, atraumatic, EOM intact, conjunctiva normal CV: Regular rate and rhythm, no murmurs rubs or gallops Pulm: Clear to auscultation bilaterally, normal work of breathing Abdomen: Soft, nondistended, bowel sounds present, no tenderness to palpation MSK: No lower extremity edema Skin: Warm and dry Neuro: Alert and oriented x3   EKG: personally reviewed my interpretation is atrial fibrillation with RVR  CXR: personally reviewed my interpretation is no acute cardiopulmonary process  Assessment & Plan by Problem: Principal Problem:   Atrial fibrillation with RVR ([place])  [name] is a 56 year old female with  a past medical history of paroxysmal atrial fibrillation mixed valvular disease with AI, AAS and history of subaortic stenosis presenting with left arm and chest pain found to be in atrial fibrillation with RVR.  Atrial fibrillation with RVR Valvular heart disease, subaortic stenosis with AI Patient presented with left arm and chest pain and found to be in atrial fibrillation with RVR.  She has been consistent with her home medications which include diltiazem 300 mg daily and Eliquis.  CHA2DS2-VASc score is 2  (based on hypertension and gender).  Patient converted back to normal sinus rhythm this afternoon.  She has had numerous previous visits with atrial fibrillation RVR.  No obvious trigger to current recurrence of atrial fibrillation with RVR.  EKG without signs of ischemic evidence and troponin flat. No signs or symptoms of infection.  Patient does endorse history of bright red blood per rectum; of note her hemoglobin has improved since her last hospitalization.  Will monitor for signs of GI bleed.  Last echocardiogram 11/22 showed normal EF of 60 to 65% with no regional wall abnormalities; mild concentric left ventricular hypertrophy and normal right heart function.  Spoke with on-call cardiologist who recommended transitioning patient to metoprolol tartrate 25 mg twice daily. -Follow-up TSH, hemoglobin A1c -Trend CBC -Follow-up echocardiogram; Consult cardiology if worsening findings on echocardiogram -Start metoprolol tartrate 25 mg twice daily, discontinue diltiazem drip ~1 hour after starting metoprolol -Goal of potassium > 4 and magnesium greater > 2  Microcytic anemia Hemoglobin 11.6, improved from 10 about a month ago.  Patient reports history of bright red blood per rectum both in the stool and when she wipes.  She denies a history of hemorrhoids.  She has not had a colonoscopy.  She declined rectal examination at time of admission.  Will trend CBC and monitor for signs or symptoms of bleeding. -Trend CBC -Consider diagnostic versus screening colonoscopy at discharge or sooner if signs of bleeding  Hypertension History of multiple elevated blood pressure readings outpatient office visit is.  She is currently only on diltiazem.  Blood pressure controlled today.  Will continue to monitor.  Diet: Regular IVF: none VTE ppx: Eliquis  Dispo: Admit patient to Inpatient with expected length of stay greater than 2 midnights.  Signed: Mike Craze, DO 03/12/2021, 4:00 PM  After 5pm on weekdays  and 1pm on weekends: On Call pager: [phone]

## 2021-03-12 NOTE — ED Provider Notes (Signed)
[facility] EMERGENCY DEPARTMENT Provider Note   CSN: [id number] Arrival date & time: 03/12/21  8295     History  Chief Complaint  Patient presents with   Arm Pain   Chest Pain    Whitney Juarez is a 56 y.o. female.  Whitney Juarez is a 56 y.o. female with hx of paroxsymal A. Fib, HTN, aortic stenosis & regurg, who presents complaining of left arm pain, chest pain and palpitations, Symptoms started when she woke up this morning at 7 AM. She reports pain primarily in the left mid arm, no numbness or weakness. She reports some associated mild central chest pain. Reports when she woke up it felt like her heart was racing, but she did not check her heart rate at home. No associated shortness of breath, some light headedness, but no syncopal episodes. No N/V or abdominal pain. Reports compliance with her cardizem and eliquis, aside from not taking her morning doses today.  The history is provided by the patient and a relative. The history is limited by a language barrier. A language interpreter was used.  Chest Pain Associated symptoms: palpitations   Associated symptoms: no abdominal pain, no fever, no nausea, no shortness of breath and no vomiting       Home Medications Prior to Admission medications   Medication Sig Start Date End Date Taking? Authorizing Provider  apixaban (ELIQUIS) 5 MG TABS tablet Take 1 tablet (5 mg total) by mouth 2 (two) times daily. 01/25/21   Fenton, Clint R, PA  diltiazem (CARDIZEM CD) 300 MG 24 hr capsule Take 1 capsule (300 mg total) by mouth daily. 02/24/21   Fenton, Clint R, PA  ibuprofen (ADVIL) 400 MG tablet Take 400 mg by mouth every 6 (six) hours as needed for mild pain or moderate pain.    [provider]      Allergies    Patient has no known allergies.    Review of Systems   Review of Systems  Constitutional:  Negative for chills and fever.  Respiratory:  Negative for shortness of breath.    Cardiovascular:  Positive for chest pain and palpitations. Negative for leg swelling.  Gastrointestinal:  Negative for abdominal pain, nausea and vomiting.  Genitourinary:  Negative for dysuria.  Musculoskeletal:  Positive for arthralgias.       Left arm pain  Neurological:  Positive for light-headedness. Negative for syncope.   Physical Exam Updated Vital Signs BP 122/81    Pulse 88    Temp (!) 97.5 F (36.4 C) (Oral)    Resp 19    Ht 4\' 11"  (1.499 m)    Wt 72.6 kg    SpO2 98%    BMI 32.32 kg/m  Physical Exam Vitals and nursing note reviewed.  Constitutional:      General: She is not in acute distress.    Appearance: Normal appearance. She is well-developed. She is ill-appearing. She is not diaphoretic.     Comments: Alert, chronically ill-appearing, not in acute distress  HENT:     Head: Normocephalic and atraumatic.  Eyes:     General:        Right eye: No discharge.        Left eye: No discharge.     Pupils: Pupils are equal, round, and reactive to light.  Cardiovascular:     Rate and Rhythm: Tachycardia present. Rhythm irregular.     Pulses: Normal pulses.          Radial  pulses are 2+ on the right side and 2+ on the left side.     Heart sounds: Normal heart sounds. No murmur heard.    Comments: Irregularly irregular with rates in the 130s-170s Pulmonary:     Effort: Pulmonary effort is normal. No respiratory distress.     Breath sounds: Normal breath sounds. No wheezing or rales.     Comments: Respirations equal and unlabored, patient able to speak in full sentences, lungs clear to auscultation bilaterally  Chest:     Chest wall: No tenderness.  Abdominal:     General: Bowel sounds are normal. There is no distension.     Palpations: Abdomen is soft. There is no mass.     Tenderness: There is no abdominal tenderness. There is no guarding.     Comments: Abdomen soft, nondistended, nontender to palpation in all quadrants without guarding or peritoneal signs   Musculoskeletal:        General: No deformity.     Cervical back: Neck supple.     Right lower leg: No tenderness. No edema.     Left lower leg: No tenderness. No edema.  Skin:    General: Skin is warm and dry.     Capillary Refill: Capillary refill takes less than 2 seconds.  Neurological:     Mental Status: She is alert and oriented to person, place, and time.     Coordination: Coordination normal.     Comments: Speech is clear, able to follow commands Moves extremities without ataxia, coordination intact  Psychiatric:        Mood and Affect: Mood normal.        Behavior: Behavior normal.    ED Results / Procedures / Treatments   Labs (all labs ordered are listed, but only abnormal results are displayed) Labs Reviewed  BASIC METABOLIC PANEL - Abnormal; Notable for the following components:      Result Value   Potassium 3.4 (*)    Glucose, Bld 144 (*)    Calcium 8.8 (*)    All other components within normal limits  CBC WITH DIFFERENTIAL/PLATELET - Abnormal; Notable for the following components:   Hemoglobin 11.6 (*)    MCV 74.9 (*)    MCH 22.7 (*)    RDW 15.6 (*)    All other components within normal limits  BRAIN NATRIURETIC PEPTIDE  MAGNESIUM  TROPONIN I (HIGH SENSITIVITY)  TROPONIN I (HIGH SENSITIVITY)    EKG EKG Interpretation  Date/Time:  Sunday March 12 2021 08:35:16 EST Ventricular Rate:  174 PR Interval:  101 QRS Duration: 78 QT Interval:  247 QTC Calculation: 421 R Axis:   11 Text Interpretation: afib with rvr LVH with secondary repolarization abnormality Inferoposterior infarct, recent When compared to prior, faster rate. Confirmed by Antony Blackbird [phone]) on 03/12/2021 8:54:41 AM  Radiology DG Chest Port 1 View  Result Date: 03/12/2021 CLINICAL DATA:  Chest pain EXAM: PORTABLE CHEST 1 VIEW COMPARISON:  On 10/25/2020 FINDINGS: 0848 hours. The cardio pericardial silhouette is enlarged. The lungs are clear without focal pneumonia, edema,  pneumothorax or pleural effusion. The visualized bony structures of the thorax show no acute abnormality. Telemetry leads overlie the chest. IMPRESSION: No active disease. Electronically Signed   By: Misty Stanley M.D.   On: 03/12/2021 08:59    Procedures .Critical Care Performed by: Jacqlyn Larsen, PA-C Authorized by: Jacqlyn Larsen, PA-C   Critical care provider statement:    Critical care time (minutes):  45   Critical care  was necessary to treat or prevent imminent or life-threatening deterioration of the following conditions:  Cardiac failure (A. fib RVR)   Critical care was time spent personally by me on the following activities:  Development of treatment plan with patient or surrogate, discussions with consultants, evaluation of patient's response to treatment, examination of patient, ordering and review of laboratory studies, ordering and review of radiographic studies, ordering and performing treatments and interventions, pulse oximetry, re-evaluation of patient's condition and review of old charts   Care discussed with: admitting provider      Medications Ordered in ED Medications  diltiazem (CARDIZEM) 125 mg in dextrose 5% 125 mL (1 mg/mL) infusion (10 mg/hr Intravenous Infusion Verify 03/12/21 1009)  apixaban (ELIQUIS) tablet 5 mg (5 mg Oral Given 03/12/21 0907)  sodium chloride 0.9 % bolus 500 mL (0 mLs Intravenous Stopped 03/12/21 0945)  diltiazem (CARDIZEM) injection 15 mg (15 mg Intravenous Given 03/12/21 0854)    ED Course/ Medical Decision Making/ A&P                           Krishana Lutze is a 56 y.o. female presents to the ED for concern of CP, arm pain and palpitation, this involves an extensive number of treatment options, and is a complaint that carries with it a high risk of complications and morbidity.  The differential diagnosis includes ACS, arrhythmia, A fib RVR, PE, pneumonia, CHF, MSK pain   Additional history obtained:  Additional history obtained from  son at bedside External records from outside source obtained and reviewed including prio admits and ED encouters for A fib   Lab Tests:  I Ordered, reviewed, and interpreted labs.  The pertinent results include:  No leukocytosis, stable hgb, no significant electrolyte derangements, trops negative, COVID/Flu neg   Imaging Studies ordered:  I ordered imaging studies including CXR  I independently visualized and interpreted imaging which showed no active cardiopulmonary disease I agree with the radiologist interpretation   Cardiac Monitoring:  The patient was maintained on a cardiac monitor.  I personally viewed and interpreted the cardiac monitored which showed an underlying rhythm of: Afib RVR with rates 120s-170s   Medicines ordered and prescription drug management:  I ordered medication including cardizem load and infusion and eliquis  for Afib RVR  Reevaluation of the patient after these medicines showed that the patient improved I have reviewed the patients home medicines and have made adjustments as needed   Critical Interventions:  Cardizem drip   ED Course:  Patient arrives complaining of central chest pain and some left arm pain as well as palpitations and found to be in A-fib RVR, work-up has been reassuring and does not suggest acute ischemia, suspect chest pain may be in the setting of demand from elevated heart rate.  Patient started on Cardizem infusion and heart rate is improving.  Will consult for admission.   Consultations Obtained:  I requested consultation with the cardiologist,  and discussed lab and imaging findings as well as pertinent plan -with Dr. Phineas Inches, she agrees with plan for medicine admission and continued Cardizem, no additional recommendations at this time  Reevaluation:  After the interventions noted above, I reevaluated the patient and found that they have :improved   Dispostion:  After consideration of the diagnostic results and  the patients response to treatment feel that the patent would benefit from admission.  Internal medicine teaching service consulted for admission, discussed labs, imaging and pertinent plan  and they will see and admit the patient..          Final Clinical Impression(s) / ED Diagnoses Final diagnoses:  Atrial fibrillation with RVR [facility])    Rx / DC Orders ED Discharge Orders     None         Jacqlyn Larsen, [place] 03/14/21 2050    Tegeler, Gwenyth Allegra, MD 03/15/21 [phone]

## 2021-03-13 ENCOUNTER — Other Ambulatory Visit (HOSPITAL_COMMUNITY): Payer: Self-pay

## 2021-03-13 ENCOUNTER — Inpatient Hospital Stay (HOSPITAL_BASED_OUTPATIENT_CLINIC_OR_DEPARTMENT_OTHER): Payer: BC Managed Care – PPO

## 2021-03-13 DIAGNOSIS — I4891 Unspecified atrial fibrillation: Secondary | ICD-10-CM | POA: Diagnosis not present

## 2021-03-13 LAB — BASIC METABOLIC PANEL
Anion gap: 9 (ref 5–15)
BUN: 11 mg/dL (ref 6–20)
CO2: 23 mmol/L (ref 22–32)
Calcium: 8.6 mg/dL — ABNORMAL LOW (ref 8.9–10.3)
Chloride: 107 mmol/L (ref 98–111)
Creatinine, Ser: 0.59 mg/dL (ref 0.44–1.00)
GFR, Estimated: >60 mL/min (ref >60)
Glucose, Bld: 104 mg/dL — ABNORMAL HIGH (ref 70–99)
Potassium: 3.7 mmol/L (ref 3.5–5.1)
Sodium: 139 mmol/L (ref 135–145)

## 2021-03-13 LAB — CBC WITH DIFFERENTIAL/PLATELET
Abs Immature Granulocytes: 0.02 10*3/uL (ref 0.00–0.07)
Basophils Absolute: 0.1 10*3/uL (ref 0.0–0.1)
Basophils Relative: 1 %
Eosinophils Absolute: 0.2 10*3/uL (ref 0.0–0.5)
Eosinophils Relative: 2 %
HCT: 33.8 % — ABNORMAL LOW (ref 36.0–46.0)
Hemoglobin: 10.5 g/dL — ABNORMAL LOW (ref 12.0–15.0)
Immature Granulocytes: 0 %
Lymphocytes Relative: 29 %
Lymphs Abs: 2.1 10*3/uL (ref 0.7–4.0)
MCH: 23 pg — ABNORMAL LOW (ref 26.0–34.0)
MCHC: 31.1 g/dL (ref 30.0–36.0)
MCV: 74.1 fL — ABNORMAL LOW (ref 80.0–100.0)
Monocytes Absolute: 0.6 10*3/uL (ref 0.1–1.0)
Monocytes Relative: 8 %
Neutro Abs: 4.2 10*3/uL (ref 1.7–7.7)
Neutrophils Relative %: 60 %
Platelets: 283 10*3/uL (ref 150–400)
RBC: 4.56 MIL/uL (ref 3.87–5.11)
RDW: 16 % — ABNORMAL HIGH (ref 11.5–15.5)
WBC: 7.1 10*3/uL (ref 4.0–10.5)
nRBC: 0 % (ref 0.0–0.2)

## 2021-03-13 LAB — HEMOGLOBIN A1C
Hgb A1c MFr Bld: 5.3 % (ref 4.8–5.6)
Mean Plasma Glucose: 105.41 mg/dL

## 2021-03-13 LAB — ECHOCARDIOGRAM LIMITED
AR max vel: 0.67 cm2
AV Area VTI: 0.63 cm2
AV Area mean vel: 0.65 cm2
AV Mean grad: 35 mmHg
AV Peak grad: 57.5 mmHg
Ao pk vel: 3.79 m/s
Height: 59 in
P 1/2 time: 319 msec
S' Lateral: 2.9 cm
Weight: 2448 oz

## 2021-03-13 MED ORDER — METOPROLOL TARTRATE 25 MG PO TABS
25.0000 mg | ORAL_TABLET | Freq: Two times a day (BID) | ORAL | Status: DC
  Administered 2021-03-13: 25 mg via ORAL

## 2021-03-13 MED ORDER — METOPROLOL TARTRATE 12.5 MG HALF TABLET: 12.5000 mg | ORAL_TABLET | Freq: Two times a day (BID) | ORAL | Status: DC

## 2021-03-13 MED ORDER — METOPROLOL SUCCINATE ER 50 MG PO TB24: 50.0000 mg | ORAL_TABLET | Freq: Every day | ORAL | 11 refills | Status: DC

## 2021-03-13 NOTE — Hospital Course (Addendum)
Atrial fibrillation with RVR Valvular heart disease, subaortic stenosis with AI Patient presented with left arm and chest pain and found to be in atrial fibrillation with RVR.  She had been consistent with her home medications which include diltiazem 300 mg daily and Eliquis.  CHA2DS2-VASc score calculated at 2 (based on hypertension and gender).  Chest x-ray negative for active disease. BNP normal, magnesium normal, no leukocytosis or fever; patient was hemodynamically stable. EKG without signs of ischemic evidence and troponin flat.  TSH was within normal limits and HbA1c was 5.3%.  Last echocardiogram 11/22 showed normal EF of 60 to 65% with no regional wall abnormalities; mild concentric left ventricular hypertrophy and normal right heart function.  Patient converted back to normal sinus rhythm during afternoon 02/05 with diltiazem drip. Cardiology did recommended transitioning patient to metoprolol tartrate 25 mg twice daily, which was done 02/05. Repeat echo 02/06 showed LV EF 60-65% with normal function; minimally restricted aortic valve leaflets with mild calcification and thickening; mild aortic regurgitation, unchanged from previous echocardiogram in 11/22. Patient remained in sinus rhythm with rate control at time of discharge.   Microcytic anemia Hemoglobin 11.6, improved from 10 about a month ago.  Patient did report history of bright red blood per rectum both in the stool and when she wipes.  She denied a history of hemorrhoids.  She declined rectal examination at time of admission.  Hemoglobin remained stable throughout admission with day of discharge value of 10.5.   Hypertension Chronic condition. Patient normotensive throughout admission. She was transitioned from diltiazem to metoprolol during stay and tolerated this well from a hemodynamic standpoint.

## 2021-03-13 NOTE — Care Management (Signed)
1355 Case Manager received a consult regarding PCP needs and medication assistance. Case Manager met the patient at the bedside with the Spanish interpreter to discuss consult needs. Patient has insurance [employer]; therefore, Case Manager would not be able to assist with co pay amounts. Patient had questions regarding Eliquis and the cost is $500.00 she cannot afford. Case Manager mentioned that if she has not used the 30 day free card; she would be eligible to use it. Case Manager also discussed the co pay card and patient assistance via the company. Patient is without PCP at this time and she is aware to call the 1-800 number on the card to see which provider is in network with the [employer]. Patient will arrange PCP needs outpatient. No further needs at this time. Case Manager will continue to follow for additional disposition needs.   °

## 2021-03-13 NOTE — Discharge Summary (Signed)
Name: Keyerra Lamere MRN: [id number] DOB: 1965-02-28 56 y.o. PCP: Patient, No Pcp Per (Inactive)  Date of Admission: 03/12/2021  8:19 AM Date of Discharge:  03/13/2021 Attending Physician: Dr.  Cain Sieve  DISCHARGE DIAGNOSIS:  Primary Problem: Atrial fibrillation with RVR [facility])   Hospital Problems: Principal Problem:   Atrial fibrillation with RVR ([place])    DISCHARGE MEDICATIONS:   Allergies as of 03/13/2021       Reactions   Acetaminophen Nausea Only, Other (See Comments)   Dizziness, headache        Medication List     STOP taking these medications    diltiazem 300 MG 24 hr capsule Commonly known as: CARDIZEM CD       TAKE these medications    apixaban 5 MG Tabs tablet Commonly known as: Eliquis Take 1 tablet (5 mg total) by mouth 2 (two) times daily.   ibuprofen 400 MG tablet Commonly known as: ADVIL Take 400 mg by mouth every 6 (six) hours as needed for mild pain or moderate pain.   metoprolol succinate 50 MG 24 hr tablet Commonly known as: Toprol XL Take 1 tablet (50 mg total) by mouth daily. Take with or immediately following a meal.        DISPOSITION AND FOLLOW-UP:  Ms.Anaid Haney was discharged from [facility] in Stable condition. At the hospital follow up visit please address:  Atrial fibrillation with RVR Valvular heart disease, subaortic stenosis with AI Monitor patient for appropriate response to new medication change of metoprolol 25 mg BID and discontinuing diltiazem 300 mg daily.    Microcytic anemia Hemoglobin stable throughout admission without signs or symptoms of bleeding but would recheck CBC. She reports history of bright red blood per rectum both in stool and when she wipes. Would consider referral for GI for diagnostic vs. Screening colonoscopy.   Hypertension Monitor continued hemodynamic stability with new medication of metoprolol 25 mg daily.  Follow-up Recommendations: Consults:  Cardiology Labs: Basic Metabolic Profile and CBC Studies: Colonoscopy Medications: Discontinue diltiazem 300 mg daily. Start taking metoprolol 50 mg daily.   Follow-up Appointments:  Follow-up [place] Follow up.   Why: call to make appointment Contact information: [place] [address] [phone]        Skeet Latch, MD Follow up.   Specialty: Cardiology Contact information: [address] [place] [address] [phone]                 HOSPITAL COURSE:  Atrial fibrillation with RVR Valvular heart disease, subaortic stenosis with AI Patient presented with left arm and chest pain and found to be in atrial fibrillation with RVR.  She had been consistent with her home medications which include diltiazem 300 mg daily and Eliquis.  CHA2DS2-VASc score calculated at 2 (based on hypertension and gender).  Chest x-ray negative for active disease. BNP normal, magnesium normal, no leukocytosis or fever; patient was hemodynamically stable. EKG without signs of ischemic evidence and troponin flat.  TSH was within normal limits and HbA1c was 5.3%.  Last echocardiogram 11/22 showed normal EF of 60 to 65% with no regional wall abnormalities; mild concentric left ventricular hypertrophy and normal right heart function.  Patient converted back to normal sinus rhythm during afternoon 02/05 with diltiazem drip. Cardiology did recommended transitioning patient to metoprolol tartrate 25 mg twice daily, which was done 02/05. Repeat echo 02/06 showed LV EF 60-65%  with normal function; minimally restricted aortic valve leaflets with mild calcification and thickening; mild aortic regurgitation, unchanged from previous echocardiogram in 11/22. Patient remained in sinus rhythm with rate control at time of discharge.   Microcytic anemia Hemoglobin 11.6, improved from 10 about a month ago.  Patient did  report history of bright red blood per rectum both in the stool and when she wipes.  She denied a history of hemorrhoids.  She declined rectal examination at time of admission.  Hemoglobin remained stable throughout admission with day of discharge value of 10.5.   Hypertension Chronic condition. Patient normotensive throughout admission. She was transitioned from diltiazem to metoprolol during stay and tolerated this well from a hemodynamic standpoint.   DISCHARGE INSTRUCTIONS:   Discharge Instructions     Diet - low sodium heart healthy   Complete by: As directed    Increase activity slowly   Complete by: As directed        SUBJECTIVE:  Patient evaluated at bedside on day of discharge. She states that she feels much better from admission now that she has converted back to sinus rhythm.   Discharge Vitals:   BP 139/90    Pulse 77    Temp 97.9 F (36.6 C) (Oral)    Resp 16    Ht 4\' 11"  (1.499 m)    Wt 69.4 kg    SpO2 100%    BMI 30.90 kg/m   OBJECTIVE:  Constitutional: Well-appearing female, in no acute distress. Cardio: Regular rate and rhythm. Systolic murmur over aortic post. Pulm: Normal work of breathing on room air. MSK: Negative for extremity edema. Skin: Warm and dry. Neuro: Alert and oriented x3. No focal deficit noted. Psych: Normal mood and affect.   Pertinent Labs, Studies, and Procedures:  CBC Latest Ref Rng & Units 03/13/2021 03/12/2021 01/18/2021  WBC 4.0 - 10.5 K/uL 7.1 9.9 7.0  Hemoglobin 12.0 - 15.0 g/dL 10.5(L) 11.6(L) 10.0(L)  Hematocrit 36.0 - 46.0 % 33.8(L) 38.2 31.8(L)  Platelets 150 - 400 K/uL 283 288 271    CMP Latest Ref Rng & Units 03/13/2021 03/12/2021 01/18/2021  Glucose 70 - 99 mg/dL 104(H) 144(H) 116(H)  BUN 6 - 20 mg/dL 11 10 10   Creatinine 0.44 - 1.00 mg/dL 0.59 0.57 0.64  Sodium 135 - 145 mmol/L 139 138 135  Potassium 3.5 - 5.1 mmol/L 3.7 3.4(L) 3.6  Chloride 98 - 111 mmol/L 107 103 103  CO2 22 - 32 mmol/L 23 22 24   Calcium 8.9 - 10.3 mg/dL  8.6(L) 8.8(L) 8.5(L)  Total Protein 6.5 - 8.1 g/dL - - 6.8  Total Bilirubin 0.3 - 1.2 mg/dL - - 0.6  Alkaline Phos 38 - 126 U/L - - 65  AST 15 - 41 U/L - - 20  ALT 0 - 44 U/L - - 21    DG Chest Port 1 View  Result Date: 03/12/2021 CLINICAL DATA:  Chest pain EXAM: PORTABLE CHEST 1 VIEW COMPARISON:  On 10/25/2020 FINDINGS: 0848 hours. The cardio pericardial silhouette is enlarged. The lungs are clear without focal pneumonia, edema, pneumothorax or pleural effusion. The visualized bony structures of the thorax show no acute abnormality. Telemetry leads overlie the chest. IMPRESSION: No active disease. Electronically Signed   By: Misty Stanley M.D.   On: 03/12/2021 08:59     Signed: Farrel Gordon, D.O.  Internal Medicine Resident, PGY-1 [facility]  Pager: [phone] 2:20 PM, 03/13/2021

## 2021-03-13 NOTE — Progress Notes (Signed)
Limited 2D echocardiogram completed.  03/13/2021 11:44 AM Kelby Aline., MHA, RVT, RDCS, RDMS

## 2021-03-22 ENCOUNTER — Other Ambulatory Visit: Payer: Self-pay

## 2021-03-22 ENCOUNTER — Encounter (HOSPITAL_COMMUNITY): Payer: Self-pay | Admitting: Physician Assistant

## 2021-03-22 ENCOUNTER — Ambulatory Visit (HOSPITAL_COMMUNITY)
Admission: RE | Admit: 2021-03-22 | Discharge: 2021-03-22 | Disposition: A | Discharge status: Home / Self Care | Payer: BC Managed Care – PPO | Source: Ambulatory Visit | Attending: Physician Assistant | Admitting: Physician Assistant

## 2021-03-22 VITALS — BP 138/88 | HR 79 | Ht 59.0 in | Wt 154.8 lb

## 2021-03-22 DIAGNOSIS — I480 Paroxysmal atrial fibrillation: Secondary | ICD-10-CM | POA: Insufficient documentation

## 2021-03-22 DIAGNOSIS — Z6831 Body mass index (BMI) 31.0-31.9, adult: Secondary | ICD-10-CM | POA: Diagnosis not present

## 2021-03-22 DIAGNOSIS — Z7182 Exercise counseling: Secondary | ICD-10-CM | POA: Diagnosis not present

## 2021-03-22 DIAGNOSIS — I10 Essential (primary) hypertension: Secondary | ICD-10-CM | POA: Insufficient documentation

## 2021-03-22 DIAGNOSIS — E669 Obesity, unspecified: Secondary | ICD-10-CM | POA: Diagnosis not present

## 2021-03-22 DIAGNOSIS — Q244 Congenital subaortic stenosis: Secondary | ICD-10-CM | POA: Insufficient documentation

## 2021-03-22 DIAGNOSIS — Z7901 Long term (current) use of anticoagulants: Secondary | ICD-10-CM | POA: Insufficient documentation

## 2021-03-22 MED ORDER — FLECAINIDE ACETATE 50 MG PO TABS: 50.0000 mg | ORAL_TABLET | Freq: Two times a day (BID) | ORAL | 3 refills | Status: DC

## 2021-03-22 NOTE — Progress Notes (Signed)
Primary Care Physician: Patient, No Pcp Per (Inactive) Primary Cardiologist: Dr Oval Linsey Primary Electrophysiologist: none Referring Physician: Dr Adah Salvage Whitney Juarez is a 56 y.o. female with a history of HTN, subaortic stenosis, AI, atrial fibrillation who presents for follow up in the [facility]. Seen by Roderic Palau NP remotely. Patient presented to the ED 01/04/21 with sudden onset palpitations and chest pain. ECG showed afib with RVR. She was started on IV diltiazem and converted to SR. She reports that she had ran out of her medications. She has had episodes of afib in the past with missed doses of diltiazem. Patient is on Eliquis for a CHADS2VASC score of 2.   On follow up today, patient presented to the ED 03/12/21 with acute chest pain and palpitations. She was found to be in afib with RVR. She was started on a diltiazem gtt which converted her to SR. At discharge, her diltiazem was changed to metoprolol. There were no specific triggers for her afib that she could identify.   Today, she denies symptoms of palpitations, chest pain, shortness of breath, orthopnea, PND, lower extremity edema, dizziness, presyncope, syncope, snoring, daytime somnolence, bleeding, or neurologic sequela. The patient is tolerating medications without difficulties and is otherwise without complaint today.    Atrial Fibrillation Risk Factors:  she does not have symptoms or diagnosis of sleep apnea. she does not have a history of rheumatic fever. she does not have a history of alcohol use. The patient does have a history of early familial atrial fibrillation or other arrhythmias. Father and brother have afib.  she has a BMI of Body mass index is 31.27 kg/m.Marland Kitchen Filed Weights   03/22/21 1039  Weight: 70.2 kg    Family History  Problem Relation Age of Onset   Heart disease Brother      Atrial Fibrillation Management history:  Previous antiarrhythmic  drugs: none Previous cardioversions: 08/2020, 10/2020 Previous ablations: none CHADS2VASC score: 2 Anticoagulation history: Eliquis   Past Medical History:  Diagnosis Date   Aortic regurgitation 04/20/2016   Atrial fibrillation ([place]) 05/27/2015   Hypertension 05/27/2015   Severe aortic stenosis 03/12/2016   Subaortic stenosis 05/26/2016   Past Surgical History:  Procedure Laterality Date   BUBBLE STUDY  06/10/2020   Procedure: BUBBLE STUDY;  Surgeon: Fay Records, MD;  Location: [place];  Service: Cardiovascular;;   CESAREAN SECTION     TEE WITHOUT CARDIOVERSION N/A 03/27/2016   Procedure: TRANSESOPHAGEAL ECHOCARDIOGRAM (TEE);  Surgeon: Skeet Latch, MD;  Location: [place];  Service: Cardiovascular;  Laterality: N/A;   TEE WITHOUT CARDIOVERSION N/A 06/10/2020   Procedure: TRANSESOPHAGEAL ECHOCARDIOGRAM (TEE);  Surgeon: Fay Records, MD;  Location: [facility] ENDOSCOPY;  Service: Cardiovascular;  Laterality: N/A;    Current Outpatient Medications  Medication Sig Dispense Refill   apixaban (ELIQUIS) 5 MG TABS tablet Take 1 tablet (5 mg total) by mouth 2 (two) times daily. 56 tablet 0   flecainide (TAMBOCOR) 50 MG tablet Take 1 tablet (50 mg total) by mouth 2 (two) times daily. 60 tablet 3   ibuprofen (ADVIL) 400 MG tablet Take 400 mg by mouth every 6 (six) hours as needed for mild pain or moderate pain.     metoprolol succinate (TOPROL XL) 50 MG 24 hr tablet Take 1 tablet (50 mg total) by mouth daily. Take with or immediately following a meal. 30 tablet 11   No current facility-administered medications for this encounter.    Allergies  Allergen Reactions  Acetaminophen Nausea Only and Other (See Comments)    Dizziness, headache    Social History   Socioeconomic History   Marital status: Married    Spouse name: Not on file   Number of children: Not on file   Years of education: Not on file   Highest education level: Not on file  Occupational History   Not on file  Tobacco  Use   Smoking status: Never   Smokeless tobacco: Never  Substance and Sexual Activity   Alcohol use: No   Drug use: No   Sexual activity: Not on file  Other Topics Concern   Not on file  Social History Narrative   Not on file   Social Determinants of Health   Financial Resource Strain: Not on file  Food Insecurity: Not on file  Transportation Needs: Not on file  Physical Activity: Not on file  Stress: Not on file  Social Connections: Not on file  Intimate Partner Violence: Not on file     ROS- All systems are reviewed and negative except as per the HPI above.  Physical Exam: Vitals:   03/22/21 1039  BP: 138/88  Pulse: 79  Weight: 70.2 kg  Height: 4\' 11"  (1.499 m)    GEN- The patient is a well appearing obese female, alert and oriented x 3 today.   HEENT-head normocephalic, atraumatic, sclera clear, conjunctiva pink, hearing intact, trachea midline. Lungs- Clear to ausculation bilaterally, normal work of breathing Heart- Regular rate and rhythm, no murmurs, rubs or gallops  GI- soft, NT, ND, + BS Extremities- no clubbing, cyanosis, or edema MS- no significant deformity or atrophy Skin- no rash or lesion Psych- euthymic mood, full affect Neuro- strength and sensation are intact   Wt Readings from Last 3 Encounters:  03/22/21 70.2 kg  03/12/21 69.4 kg  01/18/21 73.2 kg    EKG today demonstrates  SR, NST Vent. rate 79 BPM PR interval 136 ms QRS duration 80 ms QT/QTcB 376/431 ms  Echo 03/13/21 demonstrated   1. Left ventricular ejection fraction, by estimation, is 60 to 65%. The  left ventricle has normal function.   2. Aortic valve leaflets seem minimally restricted. Elevated transaortic  velocity may be secondary to narrow outflor. Consider repeat TEE/ cardiac MRI for further evaluation. There is mild calcification of the aortic valve. There is mild thickening of the aortic valve. Aortic valve regurgitation is mild. Aortic regurgitation PHT measures 319  msec. Aortic valve area, by VTI measures 0.63 cm. Aortic valve mean gradient measures 35.0 mmHg. Aortic valve Vmax measures 3.79  m/s.   Epic records are reviewed at length today  CHA2DS2-VASc Score = 2  The patient's score is based upon: CHF History: 0 HTN History: 1 Diabetes History: 0 Stroke History: 0 Vascular Disease History: 0 Age Score: 0 Gender Score: 1        ASSESSMENT AND PLAN: 1. Paroxysmal Atrial Fibrillation (ICD10:  I48.0) The patient's CHA2DS2-VASc score is 2, indicating a 2.2% annual risk of stroke.   We discussed rhythm control options today. Will start flecainide 50 mg BID. Check a follow up ECG next week.  Continue Eliquis 5 mg BID Continue metoprolol 50 mg daily  2. Obesity Body mass index is 31.27 kg/m. Lifestyle modification was discussed and encouraged including regular physical activity and weight reduction.  3. Valvular heart disease Subaortic stenosis with AI Followed by Dr Oval Linsey   Follow up in the AF clinic next week for ECG.    Ricky Tandy Grawe PA-C Afib  [facility] [address] [place],  [address] [phone] 03/22/2021 12:10 PM

## 2021-03-22 NOTE — Patient Instructions (Signed)
Start Flecainide 50mg twice a day 

## 2021-03-28 ENCOUNTER — Encounter: Payer: BC Managed Care – PPO | Admitting: Internal Medicine

## 2021-03-28 ENCOUNTER — Ambulatory Visit (HOSPITAL_COMMUNITY): Payer: BC Managed Care – PPO | Admitting: Physician Assistant

## 2021-03-31 ENCOUNTER — Ambulatory Visit (HOSPITAL_COMMUNITY)
Admission: RE | Admit: 2021-03-31 | Discharge: 2021-03-31 | Disposition: A | Discharge status: Home / Self Care | Payer: BC Managed Care – PPO | Source: Ambulatory Visit | Attending: Physician Assistant | Admitting: Physician Assistant

## 2021-03-31 ENCOUNTER — Other Ambulatory Visit: Payer: Self-pay

## 2021-03-31 DIAGNOSIS — I480 Paroxysmal atrial fibrillation: Secondary | ICD-10-CM | POA: Diagnosis not present

## 2021-03-31 NOTE — Progress Notes (Signed)
Patient returns for ECG after starting flecainide. ECG shows SR HR 68, PR 154, QRS 82, QTc 425. No heart racing or noticeable side effects since starting the medication. F/u with Dr Oval Linsey in 3 months, AF clinic in 6 months.

## 2021-03-31 NOTE — Patient Instructions (Signed)
See Dr. Oval Linsey in 3 months - their office number is [phone]

## 2021-06-18 ENCOUNTER — Other Ambulatory Visit: Payer: Self-pay | Admitting: Internal Medicine

## 2021-06-18 DIAGNOSIS — I480 Paroxysmal atrial fibrillation: Secondary | ICD-10-CM

## 2021-06-19 ENCOUNTER — Other Ambulatory Visit (HOSPITAL_COMMUNITY): Payer: Self-pay | Admitting: Physician Assistant

## 2021-06-19 NOTE — Telephone Encounter (Signed)
Eliquis '5mg'$  refill request received. Patient is 56 years old, weight-70.2kg, Crea-0.59 on 03/13/2021, Diagnosis-Afib, and last seen by Malka So on 03/22/2021. Dose is appropriate based on dosing criteria. Will send in refill to requested pharmacy.   ?

## 2021-07-04 ENCOUNTER — Encounter (HOSPITAL_BASED_OUTPATIENT_CLINIC_OR_DEPARTMENT_OTHER): Payer: Self-pay | Admitting: Cardiovascular Disease

## 2021-07-04 ENCOUNTER — Ambulatory Visit (INDEPENDENT_AMBULATORY_CARE_PROVIDER_SITE_OTHER): Payer: BC Managed Care – PPO | Admitting: Cardiovascular Disease

## 2021-07-04 VITALS — BP 138/84 | HR 67 | Ht 59.0 in | Wt 162.0 lb

## 2021-07-04 DIAGNOSIS — I10 Essential (primary) hypertension: Secondary | ICD-10-CM

## 2021-07-04 DIAGNOSIS — Q244 Congenital subaortic stenosis: Secondary | ICD-10-CM

## 2021-07-04 DIAGNOSIS — I480 Paroxysmal atrial fibrillation: Secondary | ICD-10-CM | POA: Diagnosis not present

## 2021-07-04 NOTE — Progress Notes (Signed)
Cardiology Office Note   Date:  07/04/2021   ID:  Whitney Juarez, DOB 12-31-1965, MRN [id number]  PCP:  Patient, No Pcp Per (Inactive)  Cardiologist:   Skeet Latch, MD   Chief Complaint  Patient presents with   Follow-up      History of Present Illness: Whitney Juarez is a 56 y.o. female with severely elevated aortic valve gradients and mild-moderate aortic regurgitation, hypertension and paroxysmal atrial fibrillation who presents for follow-up. She presented to the emergency department 05/2015 with new onset of atrial fibrillation with rapid ventricular response. She was started on a diltiazem drip and underwent cardioversion in the emergency department. She was started on diltiazem and Eliquis at that time.  She was seen in the atrial fibrillation clinic 05/31/15 and instructed to stop Eliquis after 1 month due to [facility] of 1.  She had an outpatient echocardiogram 07/11/15 that revealed LVEF 60-65% with grade 2 diastolic dysfunction. She also had mild aortic regurgitation and moderate to severe aortic stenosis (mean gradient 37 mmHg, peak velocity 3.7 m/s).  She saw Dr. Johnsie Cancel 07/2015 and was doing well.  She had a follow-up echocardiogram 03/05/16 that revealed LVEF 60-65% with severe aortic stenosis and moderate aortic regurgitation.  However visually her valve did not appear to be severely stenotic and transesophageal echocardiogram was recommended.  She underwent TEE 03/27/16 that revealed a normal aortic valve but severely elevated aortic valve gradients and mild to moderate aortic regurgitation.  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  She had a normal aortic valve no CAD.  She was hospitalized 03/2021 with A-fib with RVR.  She was which from diltiazem to metoprolol.  She was previously evaluated by Dr. Orvan Seen 07/2020 for surgical consultation for her aortic stenosis and subvalvular stenosis.  They recommended 68-monthfollow-up  given lack of symptoms.  She was seen in the ED 08/2020 with chest pain and found to be in A-fib with RVR.  She underwent cardioversion and discharged from the ED.  She had a repeat episode 10/2020 and also underwent cardioversion in the ED.  She again presented with A-fib 12/2020.  She had missed a few doses of her Eliquis and therefore could not undergo cardioversion.  Diltiazem was added and she was scheduled for TEE cardioversion but converted prior to this occurring.  She followed up with afib clinic and was started on flecainide.  Lately she has been feeling quite well.  She feels much better on this regimen.  She has very short episodes of palpitations but nothing sustained.  She is walking 2 or 3 days/week for 25 to 30 minutes.  There is no dyspnea or chest pain.  She denies lower extremity edema, orthopnea, or PND.   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/26/2016    Past Surgical History:  Procedure Laterality Date   BUBBLE STUDY  06/10/2020   Procedure: BUBBLE STUDY;  Surgeon: RFay Records MD;  [place]  Service: Cardiovascular;;   CESAREAN SECTION     TEE WITHOUT CARDIOVERSION N/A 03/27/2016   Procedure: TRANSESOPHAGEAL ECHOCARDIOGRAM (TEE);  Surgeon: TSkeet Latch MD;  [place]  Service: Cardiovascular;  Laterality: N/A;   TEE WITHOUT CARDIOVERSION N/A 06/10/2020   Procedure: TRANSESOPHAGEAL ECHOCARDIOGRAM (TEE);  Surgeon: RFay Records MD;  [place]  Service: Cardiovascular;  Laterality: N/A;     Current Outpatient Medications  Medication Sig Dispense Refill   ELIQUIS 5 MG TABS tablet TAKE 1 TABLET BY MOUTH TWICE A DAY 60 tablet 5   flecainide (TAMBOCOR) 50 MG tablet TAKE 1 TABLET BY MOUTH TWICE A DAY 180 tablet 1   ibuprofen (ADVIL) 400 MG tablet Take 400 mg by mouth every 6 (six)  hours as needed for mild pain or moderate pain.     metoprolol succinate (TOPROL XL) 50 MG 24 hr tablet Take 1 tablet (50 mg total) by mouth daily. Take with or immediately following a meal. 30 tablet 11   No current facility-administered medications for this visit.    Allergies:   Acetaminophen    Social History:  The patient  reports that she has never smoked. She has never used smokeless tobacco. She reports that she does not drink alcohol and does not use drugs.   Family History:  The patient's family history includes Heart disease in her brother.    ROS:  Please see the history of present illness.   Otherwise, review of systems are positive for none.   All other systems are reviewed and negative.    PHYSICAL EXAM: VS:  BP 138/84 (BP Location: Left Arm, Patient Position: Sitting, Cuff Size: Normal)   Pulse 67   Ht '4\' 11"'$  (1.499 m)   Wt 162 lb (73.5 kg)   BMI 32.72 kg/m  , BMI Body mass index is 32.72 kg/m. GENERAL:  Well appearing.  No acute distress.   HEENT:  Pupils equal round and reactive, fundi not visualized, oral mucosa unremarkable NECK:  No jugular venous distention, waveform within normal limits, carotid upstroke brisk and symmetric LUNGS:  Clear to auscultation bilaterally HEART:  RRR.  PMI not displaced or sustained,S1 and S2 within normal limits, no S3, no S4, no clicks, no rubs, III/VI late-peaking systolic murmur at the left and right upper sternal borders. +Radiation to bilateral carotids.  ABD:  Flat, positive bowel sounds normal in frequency in pitch, no bruits, no rebound, no guarding, no midline pulsatile mass, no hepatomegaly, no splenomegaly EXT:  2 plus pulses throughout, no edema, no cyanosis no clubbing SKIN:  No rashes no nodules NEURO:  Cranial nerves II through XII grossly intact, motor grossly intact throughout PSYCH:  Cognitively intact, oriented to person place and time  EKG:  EKG is not ordered today. The ekg ordered 03/12/16 demonstrates sinus  rhythm. Rate 78 bpm. 07/04/21: Sinus rhythm.  Rate 67 bpm.    Echo 03/2021: IMPRESSIONS    1. Left ventricular ejection fraction, by estimation, is 60 to 65%. The  left ventricle has normal function.   2. Aortic valve leaflets seem minimally restricted. Elevated transaortic  velocity may be secondary to narrow outflor. Consider repeat TEE/ cardiac  MRI for further evaluation. There is mild calcification of the aortic  valve. There is mild thickening of  the aortic valve. Aortic valve regurgitation is mild. Aortic regurgitation  PHT measures 319 msec. Aortic valve area, by VTI measures 0.63 cm. Aortic  valve mean gradient measures 35.0 mmHg. Aortic valve Vmax measures 3.79  m/s.   FINDINGS   Left Ventricle: Left ventricular ejection fraction, by estimation, is 60  to 65%. The left ventricle has normal function.   Echo 01/04/21:  1. Left ventricular ejection fraction, by estimation, is 60 to 65%. The  left ventricle has normal function. The left ventricle has no regional  wall motion abnormalities. There is mild  concentric left ventricular  hypertrophy. Left ventricular diastolic  parameters are indeterminate.   2. Right ventricular systolic function is normal. The right ventricular  size is normal.   3. The mitral valve is normal in structure. No evidence of mitral valve  regurgitation. No evidence of mitral stenosis.   4. The aortic valve is calcified. There is moderate calcification of the  aortic valve. There is moderate thickening of the aortic valve. Aortic  valve regurgitation is mild to moderate, with PHT at 402 msec. Aortic  valve sclerosis/calcification is  present, without any evidence of aortic stenosis and the valve opens well.  The peak velocity and mean gradient is increased suggesting turbulence  across and the LVOT. No appreciation of a subaortic membrane. Aortic valve  mean gradient measures 29.2 mmHg.   Aortic valve Vmax measures 3.40 m/s.   5. The inferior  vena cava is normal in size with greater than 50%  respiratory variability, suggesting right atrial pressure of 3 mmHg.   Comparison(s): TEE 06/10/2020 with no evidence of subaortic membrane. But  noted restriction of motion with systolic doming.   Recent Labs: 01/18/2021: ALT 21 03/12/2021: B Natriuretic Peptide 59.1; Magnesium 2.0; TSH 1.051 03/13/2021: BUN 11; Creatinine, Ser 0.59; Hemoglobin 10.5; Platelets 283; Potassium 3.7; Sodium 139    Lipid Panel No results found for: CHOL, TRIG, HDL, CHOLHDL, VLDL, LDLCALC, LDLDIRECT    Wt Readings from Last 3 Encounters:  07/04/21 162 lb (73.5 kg)  03/22/21 154 lb 12.8 oz (70.2 kg)  03/12/21 153 lb (69.4 kg)      ASSESSMENT AND PLAN:  # Subaortic membrane:  # Aortic stenosis:  She has a known subaortic membrane.  She also has mild thickening of the aortic valve with likely mild aortic stenosis.  Pressures across her valve are 35 mmHg.  This is at least partially due to the membrane.  No plans for surgical intervention at this time.  She has been seen by surgery who evaluated and recommended continued watchful waiting and imaging.  Repeat echo in 6 months.  # Hypertension:  Blood pressure slightly above goal.  She thinks that it is better controlled at home.  She thinks that she has some whitecoat syndrome and gets anxious in the office.  She is going to check her pressures at home.  She is also going to work on increasing her exercise to at least 150 minutes weekly.  Goal blood pressures less than 130/80.  Continue metoprolol.  # Paroxysmal atrial fibrillation: She has been experiencing multiple episodes of atrial fibrillation but this has been much better since starting flecainide.  Labs are stable.  Continue flecainide, metoprolol, and Eliquis.   Current medicines are reviewed at length with the patient today.  The patient does not have concerns regarding medicines.  The following changes have been made:  no change  Labs/ tests ordered  today include:   Orders Placed This Encounter  Procedures   EKG 12-Lead   ECHOCARDIOGRAM COMPLETE     Disposition:   FU with Jadin Creque C. Oval Linsey, MD, [facility] in 3 months and after her echo in 6 months.   This note was written with the assistance of speech recognition software.  Please excuse any transcriptional errors.  Signed, Siriyah Ambrosius C. Oval Linsey, MD, [facility]  07/04/2021 11:40 AM    [place]

## 2021-07-04 NOTE — Assessment & Plan Note (Signed)
She was having recurrent atrial fibrillation but is doing well on flecainide.

## 2021-07-04 NOTE — Patient Instructions (Signed)
Medication Instructions:  Your physician recommends that you continue on your current medications as directed. Please refer to the Current Medication list given to you today.   *If you need a refill on your cardiac medications before your next appointment, please call your pharmacy*  Lab Work: NONE  Testing/Procedures: Your physician has requested that you have an echocardiogram. Echocardiography is a painless test that uses sound waves to create images of your heart. It provides your doctor with information about the size and shape of your heart and how well your heart's chambers and valves are working. This procedure takes approximately one hour. There are no restrictions for this procedure. TO BE DONE IN 6 MONHTS, FOLLOW UP AFTER ECHO    Follow-Up: At [facility], you and your health needs are our priority.  As part of our continuing mission to provide you with exceptional heart care, we have created designated Provider Care Teams.  These Care Teams include your primary Cardiologist (physician) and Advanced Practice Providers (APPs -  Physician Assistants and Nurse Practitioners) who all work together to provide you with the care you need, when you need it.  We recommend signing up for the patient portal called "MyChart".  Sign up information is provided on this After Visit Summary.  MyChart is used to connect with patients for Virtual Visits (Telemedicine).  Patients are able to view lab/test results, encounter notes, upcoming appointments, etc.  Non-urgent messages can be sent to your provider as well.   To learn more about what you can do with MyChart, go to [email or web address].    Your next appointment:   3 month(s)  The format for your next appointment:   In Person  Provider:   Skeet Latch, MD{  AND IN 6 MONTHS  Other Instructions Exercise recommendations: The American Heart Association recommends 150 minutes of moderate intensity exercise weekly. Try 30 minutes  of moderate intensity exercise 4-5 times per week. This could include walking, jogging, or swimming.  MONITOR AND LOG YOUR BLOOD PRESSURE DAILY. BRING READINGS AND MACHINE TO FOLLOW UP

## 2021-10-02 ENCOUNTER — Encounter (HOSPITAL_COMMUNITY): Payer: Self-pay | Admitting: Physician Assistant

## 2021-10-02 ENCOUNTER — Ambulatory Visit (HOSPITAL_COMMUNITY)
Admission: RE | Admit: 2021-10-02 | Discharge: 2021-10-02 | Disposition: A | Discharge status: Home / Self Care | Payer: BC Managed Care – PPO | Source: Ambulatory Visit | Attending: Physician Assistant | Admitting: Physician Assistant

## 2021-10-02 VITALS — BP 132/82 | HR 70 | Ht 59.0 in | Wt 161.2 lb

## 2021-10-02 DIAGNOSIS — Z79899 Other long term (current) drug therapy: Secondary | ICD-10-CM | POA: Insufficient documentation

## 2021-10-02 DIAGNOSIS — Z6832 Body mass index (BMI) 32.0-32.9, adult: Secondary | ICD-10-CM | POA: Diagnosis not present

## 2021-10-02 DIAGNOSIS — Q244 Congenital subaortic stenosis: Secondary | ICD-10-CM | POA: Insufficient documentation

## 2021-10-02 DIAGNOSIS — Z7901 Long term (current) use of anticoagulants: Secondary | ICD-10-CM | POA: Diagnosis not present

## 2021-10-02 DIAGNOSIS — Z7182 Exercise counseling: Secondary | ICD-10-CM | POA: Insufficient documentation

## 2021-10-02 DIAGNOSIS — I480 Paroxysmal atrial fibrillation: Secondary | ICD-10-CM | POA: Diagnosis not present

## 2021-10-02 DIAGNOSIS — I4891 Unspecified atrial fibrillation: Secondary | ICD-10-CM

## 2021-10-02 DIAGNOSIS — E669 Obesity, unspecified: Secondary | ICD-10-CM | POA: Insufficient documentation

## 2021-10-02 DIAGNOSIS — I10 Essential (primary) hypertension: Secondary | ICD-10-CM | POA: Diagnosis not present

## 2021-10-02 DIAGNOSIS — R634 Abnormal weight loss: Secondary | ICD-10-CM | POA: Insufficient documentation

## 2021-10-02 NOTE — Progress Notes (Signed)
Primary Care Physician: Patient, No Pcp Per Primary Cardiologist: Dr Oval Linsey Primary Electrophysiologist: none Referring Physician: Dr Adah Salvage Whitney Juarez is a 56 y.o. female with a history of HTN, subaortic stenosis, AI, atrial fibrillation who presents for follow up in the [facility]. Seen by Roderic Palau NP remotely. Patient presented to the ED 01/04/21 with sudden onset palpitations and chest pain. ECG showed afib with RVR. She was started on IV diltiazem and converted to SR. She reports that she had ran out of her medications. She has had episodes of afib in the past with missed doses of diltiazem. Patient is on Eliquis for a CHADS2VASC score of 2.   Patient presented to the ED 03/12/21 with acute chest pain and palpitations. She was found to be in afib with RVR. She was started on a diltiazem gtt which converted her to SR. At discharge, her diltiazem was changed to metoprolol. She was started on flecainide 03/22/21.  An interpreter was used at today's visit. On follow up today, patient reports that she has done well since her last visit. She did have a brief episode of dizziness this morning which resolved, has not had any other episodes. No tachypalpitations. No bleeding issues on anticoagulation.   Today, she denies symptoms of palpitations, chest pain, shortness of breath, orthopnea, PND, lower extremity edema, presyncope, syncope, snoring, daytime somnolence, bleeding, or neurologic sequela. The patient is tolerating medications without difficulties and is otherwise without complaint today.    Atrial Fibrillation Risk Factors:  she does not have symptoms or diagnosis of sleep apnea. she does not have a history of rheumatic fever. she does not have a history of alcohol use. The patient does have a history of early familial atrial fibrillation or other arrhythmias. Father and brother have afib.  she has a BMI of Body mass index is  32.56 kg/m.Marland Kitchen Filed Weights   10/02/21 1011  Weight: 73.1 kg    Family History  Problem Relation Age of Onset   Heart disease Brother      Atrial Fibrillation Management history:  Previous antiarrhythmic drugs: flecainide  Previous cardioversions: 08/2020, 10/2020 Previous ablations: none CHADS2VASC score: 2 Anticoagulation history: Eliquis   Past Medical History:  Diagnosis Date   Aortic regurgitation 04/20/2016   Atrial fibrillation ([place]) 05/27/2015   Hypertension 05/27/2015   Severe aortic stenosis 03/12/2016   Subaortic stenosis 05/26/2016   Past Surgical History:  Procedure Laterality Date   BUBBLE STUDY  06/10/2020   Procedure: BUBBLE STUDY;  Surgeon: Fay Records, MD;  Location: [place];  Service: Cardiovascular;;   CESAREAN SECTION     TEE WITHOUT CARDIOVERSION N/A 03/27/2016   Procedure: TRANSESOPHAGEAL ECHOCARDIOGRAM (TEE);  Surgeon: Skeet Latch, MD;  Location: [place];  Service: Cardiovascular;  Laterality: N/A;   TEE WITHOUT CARDIOVERSION N/A 06/10/2020   Procedure: TRANSESOPHAGEAL ECHOCARDIOGRAM (TEE);  Surgeon: Fay Records, MD;  Location[facility] END[facility]COPY;  Service: Cardiovascular;  Laterality: N/A;    Current Outpatient Medications  Medication Sig Dispense Refill   ELIQUIS 5 MG TABS tablet TAKE 1 TABLET BY MOUTH TWICE A DAY 60 tablet 5   flecainide (TAMBOCOR) 50 MG tablet TAKE 1 TABLET BY MOUTH TWICE A DAY 180 tablet 1   ibuprofen (ADVIL) 400 MG tablet Take 400 mg by mouth every 6 (six) hours as needed for mild pain or moderate pain.     metoprolol succinate (TOPROL XL) 50 MG 24 hr tablet Take 1 tablet (50 mg total) by  mouth daily. Take with or immediately following a meal. 30 tablet 11   No current facility-administered medications for this encounter.    Allergies  Allergen Reactions   Acetaminophen Nausea Only and Other (See Comments)    Dizziness, headache    Social History   Socioeconomic History   Marital status: Married    Spouse name:  Not on file   Number of children: Not on file   Years of education: Not on file   Highest education level: Not on file  Occupational History   Not on file  Tobacco Use   Smoking status: Never   Smokeless tobacco: Never   Tobacco comments:    Never smoke 10/02/21  Substance and Sexual Activity   Alcohol use: No   Drug use: No   Sexual activity: Not on file  Other Topics Concern   Not on file  Social History Narrative   Not on file   Social Determinants of Health   Financial Resource Strain: Not on file  Food Insecurity: Not on file  Transportation Needs: Not on file  Physical Activity: Not on file  Stress: Not on file  Social Connections: Not on file  Intimate Partner Violence: Not on file     ROS- All systems are reviewed and negative except as per the HPI above.  Physical Exam: Vitals:   10/02/21 1011  BP: 132/82  Pulse: 70  Weight: 73.1 kg  Height: '4\' 11"'$  (1.499 m)     GEN- The patient is a well appearing obese female, alert and oriented x 3 today.   HEENT-head normocephalic, atraumatic, sclera clear, conjunctiva pink, hearing intact, trachea midline. Lungs- Clear to ausculation bilaterally, normal work of breathing Heart- Regular rate and rhythm, no murmurs, rubs or gallops  GI- soft, NT, ND, + BS Extremities- no clubbing, cyanosis, or edema MS- no significant deformity or atrophy Skin- no rash or lesion Psych- euthymic mood, full affect Neuro- strength and sensation are intact   Wt Readings from Last 3 Encounters:  10/02/21 73.1 kg  07/04/21 73.5 kg  03/22/21 70.2 kg    EKG today demonstrates  SR, NST Vent. rate 70 BPM PR interval 150 ms QRS duration 86 ms QT/QTcB 406/438 ms  Echo 03/13/21 demonstrated   1. Left ventricular ejection fraction, by estimation, is 60 to 65%. The  left ventricle has normal function.   2. Aortic valve leaflets seem minimally restricted. Elevated transaortic  velocity may be secondary to narrow outflor. Consider  repeat TEE/ cardiac MRI for further evaluation. There is mild calcification of the aortic valve. There is mild thickening of the aortic valve. Aortic valve regurgitation is mild. Aortic regurgitation PHT measures 319 msec. Aortic valve area, by VTI measures 0.63 cm. Aortic valve mean gradient measures 35.0 mmHg. Aortic valve Vmax measures 3.79  m/s.   Epic records are reviewed at length today  CHA2DS2-VASc Score = 2  The patient's score is based upon: CHF History: 0 HTN History: 1 Diabetes History: 0 Stroke History: 0 Vascular Disease History: 0 Age Score: 0 Gender Score: 1       ASSESSMENT AND PLAN: 1. Paroxysmal Atrial Fibrillation (ICD10:  I48.0) The patient's CHA2DS2-VASc score is 2, indicating a 2.2% annual risk of stroke.   Patient appears to be maintaining SR.  Continue flecainide 50 mg BID. Will order TST. Continue Eliquis 5 mg BID Continue metoprolol 50 mg daily  2. Obesity Body mass index is 32.56 kg/m. Lifestyle modification was discussed and encouraged including regular physical activity  and weight reduction.  3. 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 HTN Stable, no changes today.   Follow up with Dr Oval Linsey as scheduled. AF clinic in 6 months.    [facility] [address] [place], [place] [address] [phone] 10/02/2021 10:17 AM

## 2021-10-03 NOTE — Progress Notes (Deleted)
Cardiology Office Note   Date:  10/03/2021   ID:  Whitney Juarez, DOB 04-29-1965, MRN [id number]  PCP:  Patient, No Pcp Per  Cardiologist:   Skeet Latch, MD   No chief complaint on file.     History of Present Illness: Whitney Juarez is a 56 y.o. female with severely elevated aortic valve gradients and mild-moderate aortic regurgitation, hypertension and paroxysmal atrial fibrillation who presents for follow-up. She presented to the emergency department 05/2015 with new onset of atrial fibrillation with rapid ventricular response. She was started on a diltiazem drip and underwent cardioversion in the emergency department. She was started on diltiazem and Eliquis at that time.  She was seen in the atrial fibrillation clinic 05/31/15 and instructed to stop Eliquis after 1 month due to [facility] of 1.  She had an outpatient echocardiogram 07/11/15 that revealed LVEF 60-65% with grade 2 diastolic dysfunction. She also had mild aortic regurgitation and moderate to severe aortic stenosis (mean gradient 37 mmHg, peak velocity 3.7 m/s).  She saw Dr. Johnsie Cancel 07/2015 and was doing well.  She had a follow-up echocardiogram 03/05/16 that revealed LVEF 60-65% with severe aortic stenosis and moderate aortic regurgitation.  However visually her valve did not appear to be severely stenotic and transesophageal echocardiogram was recommended.  She underwent TEE 03/27/16 that revealed a normal aortic valve but severely elevated aortic valve gradients and mild to moderate aortic regurgitation.  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  She had a normal aortic valve no CAD.  She was hospitalized 03/2021 with A-fib with RVR.  She was switched from diltiazem to metoprolol.  She was previously evaluated by Dr. Orvan Seen 07/2020 for surgical consultation for her aortic stenosis and subvalvular stenosis.  They recommended 14-monthfollow-up given lack of symptoms.  She was  seen in the ED 08/2020 with chest pain and found to be in A-fib with RVR.  She underwent cardioversion and discharged from the ED.  She had a repeat episode 10/2020 and also underwent cardioversion in the ED.  She again presented with A-fib 12/2020.  She had missed a few doses of her Eliquis and therefore could not undergo cardioversion.  Diltiazem was added and she was scheduled for TEE cardioversion but converted prior to this occurring.  She followed up with afib clinic and was started on flecainide.  Echo 03/2021 revealed LVEF 60-65% with mean subvalvular gradient 35 mmHg. She followed up in EP clinic 8/28 and was doing well.  An echo and ETT were ordered but have not yet been performed.    Past Medical History:  Diagnosis Date   Aortic regurgitation 04/20/2016   [place] 05[place]   Hypertension 05/27/2015   Severe aortic stenosis 03/12/2016   Subaortic stenosis 05/26/2016    Past Surgical History:  Procedure Laterality Date   BUBBLE STUDY  06/10/2020   Procedure: BUBBLE STUDY;  Surgeon: RFay Records MD;  [place]  Service: Cardiovascular;;   CESAREAN SECTION     TEE WITHOUT CARDIOVERSION N/A 03/27/2016   Procedure: TRANSESOPHAGEAL ECHOCARDIOGRAM (TEE);  Surgeon: TSkeet Latch MD;  [place]  Service: Cardiovascular;  Laterality: N/A;   TEE WITHOUT CARDIOVERSION N/A 06/10/2020   Procedure: TRANSESOPHAGEAL ECHOCARDIOGRAM (TEE);  Surgeon: RFay Records MD;  [facility]  Service: Cardiovascular;  Laterality: N/A;     Current Outpatient Medications  Medication Sig Dispense Refill   ELIQUIS 5 MG TABS tablet TAKE 1  TABLET BY MOUTH TWICE A DAY 60 tablet 5   flecainide (TAMBOCOR) 50 MG tablet TAKE 1 TABLET BY MOUTH TWICE A DAY 180 tablet 1   ibuprofen (ADVIL) 400 MG tablet Take 400 mg by mouth every 6 (six) hours as needed for mild pain or moderate pain.     metoprolol succinate (TOPROL XL) 50 MG 24 hr tablet Take 1 tablet (50 mg total) by  mouth daily. Take with or immediately following a meal. 30 tablet 11   No current facility-administered medications for this visit.    Allergies:   Acetaminophen    Social History:  The patient  reports that she has never smoked. She has never used smokeless tobacco. She reports that she does not drink alcohol and does not use drugs.   Family History:  The patient's family history includes Heart disease in her brother.    ROS:  Please see the history of present illness.   Otherwise, review of systems are positive for none.   All other systems are reviewed and negative.    PHYSICAL EXAM: VS:  LMP  (LMP Unknown)  , BMI There is no height or weight on file to calculate BMI. GENERAL:  Well appearing.  No acute distress.   HEENT:  Pupils equal round and reactive, fundi not visualized, oral mucosa unremarkable NECK:  No jugular venous distention, waveform within normal limits, carotid upstroke brisk and symmetric LUNGS:  Clear to auscultation bilaterally HEART:  RRR.  PMI not displaced or sustained,S1 and S2 within normal limits, no S3, no S4, no clicks, no rubs, III/VI late-peaking systolic murmur at the left and right upper sternal borders. +Radiation to bilateral carotids.  ABD:  Flat, positive bowel sounds normal in frequency in pitch, no bruits, no rebound, no guarding, no midline pulsatile mass, no hepatomegaly, no splenomegaly EXT:  2 plus pulses throughout, no edema, no cyanosis no clubbing SKIN:  No rashes no nodules NEURO:  Cranial nerves II through XII grossly intact, motor grossly intact throughout PSYCH:  Cognitively intact, oriented to person place and time  EKG:  EKG is not ordered today. The ekg ordered 03/12/16 demonstrates sinus rhythm. Rate 78 bpm. 07/04/21: Sinus rhythm.  Rate 67 bpm.    Echo 03/2021: IMPRESSIONS    1. Left ventricular ejection fraction, by estimation, is 60 to 65%. The  left ventricle has normal function.   2. Aortic valve leaflets seem minimally  restricted. Elevated transaortic  velocity may be secondary to narrow outflor. Consider repeat TEE/ cardiac  MRI for further evaluation. There is mild calcification of the aortic  valve. There is mild thickening of  the aortic valve. Aortic valve regurgitation is mild. Aortic regurgitation  PHT measures 319 msec. Aortic valve area, by VTI measures 0.63 cm. Aortic  valve mean gradient measures 35.0 mmHg. Aortic valve Vmax measures 3.79  m/s.   FINDINGS   Left Ventricle: Left ventricular ejection fraction, by estimation, is 60  to 65%. The left ventricle has normal function.   Echo 01/04/21:  1. Left ventricular ejection fraction, by estimation, is 60 to 65%. The  left ventricle has normal function. The left ventricle has no regional  wall motion abnormalities. There is mild concentric left ventricular  hypertrophy. Left ventricular diastolic  parameters are indeterminate.   2. Right ventricular systolic function is normal. The right ventricular  size is normal.   3. The mitral valve is normal in structure. No evidence of mitral valve  regurgitation. No evidence of mitral stenosis.  4. The aortic valve is calcified. There is moderate calcification of the  aortic valve. There is moderate thickening of the aortic valve. Aortic  valve regurgitation is mild to moderate, with PHT at 402 msec. Aortic  valve sclerosis/calcification is  present, without any evidence of aortic stenosis and the valve opens well.  The peak velocity and mean gradient is increased suggesting turbulence  across and the LVOT. No appreciation of a subaortic membrane. Aortic valve  mean gradient measures 29.2 mmHg.   Aortic valve Vmax measures 3.40 m/s.   5. The inferior vena cava is normal in size with greater than 50%  respiratory variability, suggesting right atrial pressure of 3 mmHg.   Comparison(s): TEE 06/10/2020 with no evidence of subaortic membrane. But  noted restriction of motion with systolic doming.    Recent Labs: 01/18/2021: ALT 21 03/12/2021: B Natriuretic Peptide 59.1; Magnesium 2.0; TSH 1.051 03/13/2021: BUN 11; Creatinine, Ser 0.59; Hemoglobin 10.5; Platelets 283; Potassium 3.7; Sodium 139    Lipid Panel No results found for: "CHOL", "TRIG", "HDL", "CHOLHDL", "VLDL", "LDLCALC", "LDLDIRECT"    Wt Readings from Last 3 Encounters:  10/02/21 161 lb 3.2 oz (73.1 kg)  07/04/21 162 lb (73.5 kg)  03/22/21 154 lb 12.8 oz (70.2 kg)      ASSESSMENT AND PLAN:  # Subaortic membrane:  # Aortic stenosis:  She has a known subaortic membrane.  She also has mild thickening of the aortic valve with likely mild aortic stenosis.  Pressures across her valve are 35 mmHg.  This is at least partially due to the membrane.  No plans for surgical intervention at this time.  She has been seen by surgery who evaluated and recommended continued watchful waiting and imaging.  Repeat echo in 6 months.  # Hypertension:  Blood pressure slightly above goal.  She thinks that it is better controlled at home.  She thinks that she has some whitecoat syndrome and gets anxious in the office.  She is going to check her pressures at home.  She is also going to work on increasing her exercise to at least 150 minutes weekly.  Goal blood pressures less than 130/80.  Continue metoprolol.  # Paroxysmal atrial fibrillation: She has been experiencing multiple episodes of atrial fibrillation but this has been much better since starting flecainide.  Labs are stable.  Continue flecainide, metoprolol, and Eliquis.   Current medicines are reviewed at length with the patient today.  The patient does not have concerns regarding medicines.  The following changes have been made:  no change  Labs/ tests ordered today include:   No orders of the defined types were placed in this encounter.    Disposition:   FU with Raju Coppolino C. Oval Linsey, MD, [facility] in 3 months and after her echo in 6 months.   This note was written with the  assistance of speech recognition software.  Please excuse any transcriptional errors.  Signed, Tennelle Taflinger C. Oval Linsey, MD, [facility]  10/03/2021 10:16 PM    [place]

## 2021-10-04 ENCOUNTER — Ambulatory Visit (HOSPITAL_BASED_OUTPATIENT_CLINIC_OR_DEPARTMENT_OTHER): Payer: BC Managed Care – PPO | Admitting: Cardiovascular Disease

## 2021-10-04 NOTE — Progress Notes (Incomplete)
Cardiology Office Note   Date:  10/04/2021   ID:  Whitney Juarez, DOB 1965-10-25, MRN [id number]  PCP:  Patient, No Pcp Per  Cardiologist:   Johnny Bridge   No chief complaint on file.   History of Present Illness: Whitney Juarez is a 56 y.o. female with severely elevated aortic valve gradients and mild-moderate aortic regurgitation, hypertension and paroxysmal atrial fibrillation who presents for follow-up. She presented to the emergency department 05/2015 with new onset of atrial fibrillation with rapid ventricular response. She was started on a diltiazem drip and underwent cardioversion in the emergency department. She was started on diltiazem and Eliquis at that time.  She was seen in the atrial fibrillation clinic 05/31/15 and instructed to stop Eliquis after 1 month due to [facility] of 1.  She had an outpatient echocardiogram 07/11/15 that revealed LVEF 60-65% with grade 2 diastolic dysfunction. She also had mild aortic regurgitation and moderate to severe aortic stenosis (mean gradient 37 mmHg, peak velocity 3.7 m/s).  She saw Dr. Johnsie Cancel 07/2015 and was doing well.  She had a follow-up echocardiogram 03/05/16 that revealed LVEF 60-65% with severe aortic stenosis and moderate aortic regurgitation.  However visually her valve did not appear to be severely stenotic and transesophageal echocardiogram was recommended.  She underwent TEE 03/27/16 that revealed a normal aortic valve but severely elevated aortic valve gradients and mild to moderate aortic regurgitation.  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  She had a normal aortic valve no CAD.  She was hospitalized 03/2021 with A-fib with RVR.  She was switched from diltiazem to metoprolol.  She was previously evaluated by Dr. Orvan Seen 07/2020 for surgical consultation for her aortic stenosis and subvalvular stenosis.  They recommended 61-monthfollow-up given lack of symptoms.  She was seen in  the ED 08/2020 with chest pain and found to be in A-fib with RVR.  She underwent cardioversion and discharged from the ED.  She had a repeat episode 10/2020 and also underwent cardioversion in the ED.  She again presented with A-fib 12/2020.  She had missed a few doses of her Eliquis and therefore could not undergo cardioversion.  Diltiazem was added and she was scheduled for TEE cardioversion but converted prior to this occurring.  She followed up with afib clinic and was started on flecainide.  Echo 03/2021 revealed LVEF 60-65% with mean subvalvular gradient 35 mmHg. She followed up in EP clinic 8/28 and was doing well.  An echo and ETT were ordered but have not yet been performed.   Today,  She denies any palpitations, chest pain, shortness of breath, or peripheral edema. No lightheadedness, headaches, syncope, orthopnea, or PND.   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    Past Surgical History:  Procedure Laterality Date   BUBBLE STUDY  06/10/2020   Procedure: BUBBLE STUDY;  Surgeon: RFay Records MD;  [facility]  Service: Cardiovascular;;   CESAREAN SECTION     TEE WITHOUT CARDIOVERSION N/A 03/27/2016   Procedure: TRANSESOPHAGEAL ECHOCARDIOGRAM (TEE);  Surgeon: TSkeet Latch MD;  [place] [facility]  Service: Cardiovascular;  Laterality: N/A;   TEE WITHOUT CARDIOVERSION N/A 06/10/2020   Procedure: TRANSESOPHAGEAL ECHOCARDIOGRAM (TEE);  Surgeon: RFay Records MD;  [facility] [facility]  Service: Cardiovascular;  Laterality:  N/A;     Current Outpatient Medications  Medication Sig Dispense Refill   ELIQUIS 5 MG TABS tablet TAKE 1 TABLET BY MOUTH TWICE A DAY 60 tablet 5   flecainide (TAMBOCOR) 50 MG tablet TAKE 1 TABLET BY MOUTH TWICE A DAY 180 tablet 1   ibuprofen (ADVIL) 400 MG tablet Take 400 mg by  mouth every 6 (six) hours as needed for mild pain or moderate pain.     metoprolol succinate (TOPROL XL) 50 MG 24 hr tablet Take 1 tablet (50 mg total) by mouth daily. Take with or immediately following a meal. 30 tablet 11   No current facility-administered medications for this visit.    Allergies:   Acetaminophen    Social History:  The patient  reports that she has never smoked. She has never used smokeless tobacco. She reports that she does not drink alcohol and does not use drugs.   Family History:  The patient's family history includes Heart disease in her brother.    ROS:   Please see the history of present illness.   All other systems are reviewed and negative.    PHYSICAL EXAM: VS:  LMP  (LMP Unknown)  , BMI There is no height or weight on file to calculate BMI. GENERAL:  Well appearing.  No acute distress.   HEENT:  Pupils equal round and reactive, fundi not visualized, oral mucosa unremarkable NECK:  No jugular venous distention, waveform within normal limits, carotid upstroke brisk and symmetric LUNGS:  Clear to auscultation bilaterally HEART:  RRR.  PMI not displaced or sustained,S1 and S2 within normal limits, no S3, no S4, no clicks, no rubs, ***III/VI late-peaking systolic murmur at the left and right upper sternal borders. +Radiation to bilateral carotids.  ABD:  Flat, positive bowel sounds normal in frequency in pitch, no bruits, no rebound, no guarding, no midline pulsatile mass, no hepatomegaly, no splenomegaly EXT:  2 plus pulses throughout, no edema, no cyanosis no clubbing SKIN:  No rashes no nodules NEURO:  Cranial nerves II through XII grossly intact, motor grossly intact throughout PSYCH:  Cognitively intact, oriented to person place and time  EKG:  EKG is personally reviewed.  10/04/21: Sinus ***. Rate *** bpm.   03/12/16: sinus rhythm. Rate 78 bpm. 07/04/21: Sinus rhythm.  Rate 67 bpm.    Other studies reviewed:  Echo 03/2021: IMPRESSIONS    1. Left  ventricular ejection fraction, by estimation, is 60 to 65%. The  left ventricle has normal function.   2. Aortic valve leaflets seem minimally restricted. Elevated transaortic  velocity may be secondary to narrow outflor. Consider repeat TEE/ cardiac  MRI for further evaluation. There is mild calcification of the aortic  valve. There is mild thickening of  the aortic valve. Aortic valve regurgitation is mild. Aortic regurgitation  PHT measures 319 msec. Aortic valve area, by VTI measures 0.63 cm. Aortic  valve mean gradient measures 35.0 mmHg. Aortic valve Vmax measures 3.79  m/s.   FINDINGS   Left Ventricle: Left ventricular ejection fraction, by estimation, is 60  to 65%. The left ventricle has normal function.   Echo 01/04/21:  1. Left ventricular ejection fraction, by estimation, is 60 to 65%. The  left ventricle has normal function. The left ventricle has no regional  wall motion abnormalities. There is mild concentric left ventricular  hypertrophy. Left ventricular diastolic  parameters are indeterminate.   2. Right ventricular systolic function is normal. The right ventricular  size is normal.   3.  The mitral valve is normal in structure. No evidence of mitral valve  regurgitation. No evidence of mitral stenosis.   4. The aortic valve is calcified. There is moderate calcification of the  aortic valve. There is moderate thickening of the aortic valve. Aortic  valve regurgitation is mild to moderate, with PHT at 402 msec. Aortic  valve sclerosis/calcification is  present, without any evidence of aortic stenosis and the valve opens well.  The peak velocity and mean gradient is increased suggesting turbulence  across and the LVOT. No appreciation of a subaortic membrane. Aortic valve  mean gradient measures 29.2 mmHg.   Aortic valve Vmax measures 3.40 m/s.   5. The inferior vena cava is normal in size with greater than 50%  respiratory variability, suggesting right atrial  pressure of 3 mmHg.   Comparison(s): TEE 06/10/2020 with no evidence of subaortic membrane. But  noted restriction of motion with systolic doming.   Recent Labs: 01/18/2021: ALT 21 03/12/2021: B Natriuretic Peptide 59.1; Magnesium 2.0; TSH 1.051 03/13/2021: BUN 11; Creatinine, Ser 0.59; Hemoglobin 10.5; Platelets 283; Potassium 3.7; Sodium 139    Lipid Panel No results found for: "CHOL", "TRIG", "HDL", "CHOLHDL", "VLDL", "LDLCALC", "LDLDIRECT"    Wt Readings from Last 3 Encounters:  10/02/21 161 lb 3.2 oz (73.1 kg)  07/04/21 162 lb (73.5 kg)  03/22/21 154 lb 12.8 oz (70.2 kg)      ASSESSMENT AND PLAN: No problem-specific Assessment & Plan notes found for this encounter.   # Subaortic membrane:  # Aortic stenosis:  She has a known subaortic membrane.  She also has mild thickening of the aortic valve with likely mild aortic stenosis.  Pressures across her valve are 35 mmHg.  This is at least partially due to the membrane.  No plans for surgical intervention at this time.  She has been seen by surgery who evaluated and recommended continued watchful waiting and imaging.  Repeat echo in 6 months.  # Hypertension:  Blood pressure slightly above goal.  She thinks that it is better controlled at home.  She thinks that she has some whitecoat syndrome and gets anxious in the office.  She is going to check her pressures at home.  She is also going to work on increasing her exercise to at least 150 minutes weekly.  Goal blood pressures less than 130/80.  Continue metoprolol.  # Paroxysmal atrial fibrillation: She has been experiencing multiple episodes of atrial fibrillation but this has been much better since starting flecainide.  Labs are stable.  Continue flecainide, metoprolol, and Eliquis.   Medication Adjustments/Labs and Tests Ordered: Current medicines are reviewed at length with the patient today.  The patient does not have concerns regarding medicines.  The following changes have been  made:  no change  Labs/ tests ordered today include:   No orders of the defined types were placed in this encounter.    Disposition:   FU with Tiffany C. Oval Linsey, MD, [facility] in 3 months and after her echo in 6 months.   I,Breanna Adamick,acting as a scribe for Skeet Latch, MD.,have documented all relevant documentation on the behalf of Skeet Latch, MD,as directed by  Skeet Latch, MD while in the presence of Skeet Latch, MD.   ***  Signed, Tiffany C. Oval Linsey, MD, [facility]  10/04/2021 7:46 AM    [place]

## 2021-12-10 ENCOUNTER — Other Ambulatory Visit: Payer: Self-pay | Admitting: Cardiovascular Disease

## 2021-12-10 DIAGNOSIS — I480 Paroxysmal atrial fibrillation: Secondary | ICD-10-CM

## 2021-12-11 NOTE — Telephone Encounter (Signed)
Prescription refill request for Eliquis received. Indication: Afib  Last office visit: 10/02/21 Marlene Lard)  Scr: 0.59 (03/13/21)  Age: 56 Weight: 93.1kg  Appropriate dose and refill sent to requested pharmacy.

## 2021-12-11 NOTE — Telephone Encounter (Signed)
Please review for refill. Thank you! 

## 2021-12-13 ENCOUNTER — Other Ambulatory Visit (HOSPITAL_COMMUNITY): Payer: Self-pay | Admitting: Physician Assistant

## 2022-01-04 ENCOUNTER — Other Ambulatory Visit (HOSPITAL_BASED_OUTPATIENT_CLINIC_OR_DEPARTMENT_OTHER): Payer: BC Managed Care – PPO

## 2022-01-05 ENCOUNTER — Telehealth (HOSPITAL_BASED_OUTPATIENT_CLINIC_OR_DEPARTMENT_OTHER): Payer: Self-pay | Admitting: Cardiovascular Disease

## 2022-01-05 NOTE — Telephone Encounter (Signed)
Called patient to discuss rescheduling the Echocardiogram ordered by Dr. Jorge Mandril answer and no voice mail

## 2022-01-08 ENCOUNTER — Ambulatory Visit (HOSPITAL_BASED_OUTPATIENT_CLINIC_OR_DEPARTMENT_OTHER): Payer: BC Managed Care – PPO | Admitting: Cardiovascular Disease

## 2022-01-09 NOTE — Telephone Encounter (Signed)
Left message for patient to call and discuss rescheduling the Echocardiogram ordered by Dr. Oval Linsey

## 2022-02-09 ENCOUNTER — Other Ambulatory Visit (HOSPITAL_BASED_OUTPATIENT_CLINIC_OR_DEPARTMENT_OTHER): Payer: BC Managed Care – PPO

## 2022-02-16 ENCOUNTER — Ambulatory Visit (HOSPITAL_BASED_OUTPATIENT_CLINIC_OR_DEPARTMENT_OTHER): Payer: BC Managed Care – PPO | Admitting: Cardiovascular Disease

## 2022-02-16 NOTE — Progress Notes (Incomplete)
Cardiology Office Note  Date:  02/16/2022   ID:  Whitney Juarez, DOB 1965/08/15, MRN [id number]  PCP:  Patient, No Pcp Per  Cardiologist:   Madelin Rear     History of Present Illness: Whitney Juarez is a 57 y.o. female with severely elevated aortic valve gradients and mild-moderate aortic regurgitation, hypertension and paroxysmal atrial fibrillation who presents for follow-up. She presented to the emergency department 05/2015 with new onset of atrial fibrillation with rapid ventricular response. She was started on a diltiazem drip and underwent cardioversion in the emergency department. She was started on diltiazem and Eliquis at that time.  She was seen in the atrial fibrillation clinic 05/31/15 and instructed to stop Eliquis after 1 month due to [facility] of 1.  She had an outpatient echocardiogram 07/11/15 that revealed LVEF 60-65% with grade 2 diastolic dysfunction. She also had mild aortic regurgitation and moderate to severe aortic stenosis (mean gradient 37 mmHg, peak velocity 3.7 m/s).  She saw Dr. Johnsie Cancel 07/2015 and was doing well.  She had a follow-up echocardiogram 03/05/16 that revealed LVEF 60-65% with severe aortic stenosis and moderate aortic regurgitation.  However visually her valve did not appear to be severely stenotic and transesophageal echocardiogram was recommended.  She underwent TEE 03/27/16 that revealed a normal aortic valve but severely elevated aortic valve gradients and mild to moderate aortic regurgitation.  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  She had a normal aortic valve no CAD.  She was hospitalized 03/2021 with A-fib with RVR.  She was switched from diltiazem to metoprolol.  She was previously evaluated by Dr. Orvan Seen 07/2020 for surgical consultation for her aortic stenosis and subvalvular stenosis.  They recommended 73-monthfollow-up given lack of symptoms.  She was seen in the ED 08/2020 with chest pain and  found to be in A-fib with RVR.  She underwent cardioversion and discharged from the ED.  She had a repeat episode 10/2020 and also underwent cardioversion in the ED.  She again presented with A-fib 12/2020.  She had missed a few doses of her Eliquis and therefore could not undergo cardioversion.  Diltiazem was added and she was scheduled for TEE cardioversion but converted prior to this occurring.  She followed up with afib clinic and was started on flecainide. She followed up with Afib clinic 09/2021 and was doing well.  Today, she is accompanied by a professional interpreter.  She denies any palpitations, chest pain, shortness of breath, or peripheral edema. No lightheadedness, headaches, syncope, orthopnea, or PND.  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    Past Surgical History:  Procedure Laterality Date   BUBBLE STUDY  06/10/2020   Procedure: BUBBLE STUDY;  Surgeon: RFay Records MD;  [facility] [facility]  Service: Cardiovascular;;   CESAREAN SECTION     TEE WITHOUT CARDIOVERSION N/A 03/27/2016   Procedure: TRANSESOPHAGEAL ECHOCARDIOGRAM (TEE);  Surgeon: TSkeet Latch MD;  [place] [facility]  Service: Cardiovascular;  Laterality: N/A;   TEE WITHOUT CARDIOVERSION N/A 06/10/2020   Procedure: TRANSESOPHAGEAL ECHOCARDIOGRAM (TEE);  Surgeon: RFay Records MD;  [facility]  Service: Cardiovascular;  Laterality: N/A;     Current Outpatient Medications  Medication Sig Dispense Refill   ELIQUIS 5 MG TABS tablet TAKE 1 TABLET BY MOUTH TWICE A DAY  60 tablet 5   flecainide (TAMBOCOR) 50 MG tablet TAKE 1 TABLET BY MOUTH TWICE A DAY 180 tablet 1   ibuprofen (ADVIL) 400 MG tablet Take 400 mg by mouth every 6 (six) hours as needed for mild pain or moderate pain.     metoprolol succinate (TOPROL XL) 50 MG 24 hr  tablet Take 1 tablet (50 mg total) by mouth daily. Take with or immediately following a meal. 30 tablet 11   No current facility-administered medications for this visit.    Allergies:   Acetaminophen    Social History:  The patient  reports that she has never smoked. She has never used smokeless tobacco. She reports that she does not drink alcohol and does not use drugs.   Family History:  The patient's family history includes Heart disease in her brother.    ROS:   Please see the history of present illness.     All other systems are reviewed and negative.   PHYSICAL EXAM: VS:  LMP  (LMP Unknown)  , BMI There is no height or weight on file to calculate BMI. GENERAL:  Well appearing.  No acute distress.   HEENT:  Pupils equal round and reactive, fundi not visualized, oral mucosa unremarkable NECK:  No jugular venous distention, waveform within normal limits, carotid upstroke brisk and symmetric LUNGS:  Clear to auscultation bilaterally HEART:  RRR.  PMI not displaced or sustained,S1 and S2 within normal limits, no S3, no S4, no clicks, no rubs, ***III/VI late-peaking systolic murmur at the left and right upper sternal borders. ***+Radiation to bilateral carotids.  ABD:  Flat, positive bowel sounds normal in frequency in pitch, no bruits, no rebound, no guarding, no midline pulsatile mass, no hepatomegaly, no splenomegaly EXT:  2 plus pulses throughout, no edema, no cyanosis no clubbing SKIN:  No rashes no nodules NEURO:  Cranial nerves II through XII grossly intact, motor grossly intact throughout PSYCH:  Cognitively intact, oriented to person place and time  EKG:  EKG is personally reviewed. 02/16/2022:  Sinus ***. Rate *** bpm. 07/04/21: Sinus rhythm.  Rate 67 bpm.   03/12/16: sinus rhythm. Rate 78 bpm.   Echo 03/2021: IMPRESSIONS    1. Left ventricular ejection fraction, by estimation, is 60 to 65%. The  left ventricle has normal function.   2. Aortic valve leaflets seem  minimally restricted. Elevated transaortic  velocity may be secondary to narrow outflor. Consider repeat TEE/ cardiac  MRI for further evaluation. There is mild calcification of the aortic  valve. There is mild thickening of  the aortic valve. Aortic valve regurgitation is mild. Aortic regurgitation  PHT measures 319 msec. Aortic valve area, by VTI measures 0.63 cm. Aortic  valve mean gradient measures 35.0 mmHg. Aortic valve Vmax measures 3.79  m/s.   FINDINGS   Left Ventricle: Left ventricular ejection fraction, by estimation, is 60  to 65%. The left ventricle has normal function.   Echo 01/04/21:  1. Left ventricular ejection fraction, by estimation, is 60 to 65%. The  left ventricle has normal function. The left ventricle has no regional  wall motion abnormalities. There is mild concentric left ventricular  hypertrophy. Left ventricular diastolic  parameters are indeterminate.   2. Right ventricular systolic function is normal. The right ventricular  size is normal.   3. The mitral valve is normal in structure. No evidence of mitral valve  regurgitation. No evidence of mitral stenosis.   4. The aortic valve is calcified. There is moderate calcification of  the  aortic valve. There is moderate thickening of the aortic valve. Aortic  valve regurgitation is mild to moderate, with PHT at 402 msec. Aortic  valve sclerosis/calcification is  present, without any evidence of aortic stenosis and the valve opens well.  The peak velocity and mean gradient is increased suggesting turbulence  across and the LVOT. No appreciation of a subaortic membrane. Aortic valve  mean gradient measures 29.2 mmHg.   Aortic valve Vmax measures 3.40 m/s.   5. The inferior vena cava is normal in size with greater than 50%  respiratory variability, suggesting right atrial pressure of 3 mmHg.   Comparison(s): TEE 06/10/2020 with no evidence of subaortic membrane. But  noted restriction of motion with systolic  doming.   Recent Labs: 03/12/2021: B Natriuretic Peptide 59.1; Magnesium 2.0; TSH 1.051 03/13/2021: BUN 11; Creatinine, Ser 0.59; Hemoglobin 10.5; Platelets 283; Potassium 3.7; Sodium 139    Lipid Panel No results found for: "CHOL", "TRIG", "HDL", "CHOLHDL", "VLDL", "LDLCALC", "LDLDIRECT"    Wt Readings from Last 3 Encounters:  10/02/21 161 lb 3.2 oz (73.1 kg)  07/04/21 162 lb (73.5 kg)  03/22/21 154 lb 12.8 oz (70.2 kg)      ASSESSMENT AND PLAN:  No problem-specific Assessment & Plan notes found for this encounter.   Disposition:   FU with Tiffany C. Oval Linsey, MD, [facility] in ***3 months.  Medication Adjustments/Labs and Tests Ordered: Current medicines are reviewed at length with the patient today.  Concerns regarding medicines are outlined above.   No orders of the defined types were placed in this encounter.  No orders of the defined types were placed in this encounter.  There are no Patient Instructions on file for this visit.  I,Mathew Stumpf,acting as a Education administrator for Skeet Latch, MD.,have documented all relevant documentation on the behalf of Skeet Latch, MD,as directed by  Skeet Latch, MD while in the presence of Skeet Latch, MD.  ***  Signed, Tiffany C. Oval Linsey, MD, [facility]  02/16/2022 7:31 AM    [place]

## 2022-03-02 ENCOUNTER — Other Ambulatory Visit (HOSPITAL_BASED_OUTPATIENT_CLINIC_OR_DEPARTMENT_OTHER): Payer: BC Managed Care – PPO

## 2022-03-06 ENCOUNTER — Other Ambulatory Visit: Payer: Self-pay | Admitting: Internal Medicine

## 2022-03-07 ENCOUNTER — Encounter (HOSPITAL_BASED_OUTPATIENT_CLINIC_OR_DEPARTMENT_OTHER): Payer: Self-pay | Admitting: Cardiovascular Disease

## 2022-03-07 ENCOUNTER — Telehealth: Payer: Self-pay | Admitting: Cardiovascular Disease

## 2022-03-07 MED ORDER — METOPROLOL SUCCINATE ER 50 MG PO TB24: 50.0000 mg | ORAL_TABLET | Freq: Every day | ORAL | 1 refills | Status: DC

## 2022-03-07 NOTE — Telephone Encounter (Signed)
Rx request sent to pharmacy.  

## 2022-03-07 NOTE — Telephone Encounter (Signed)
*  STAT* If patient is at the pharmacy, call can be transferred to refill team.   1. Which medications need to be refilled? (please list name of each medication and dose if known) metoprolol succinate (TOPROL XL) 50 MG 24 hr tablet   2. Which pharmacy/location (including street and city if local pharmacy) is medication to be sent to? [id number] [place], [place] - [address] [facility]    3. Do they need a 30 day or 90 day supply? 90 days  Per pt's daughter, pt is almost out of meds and needs refill today

## 2022-04-04 ENCOUNTER — Ambulatory Visit (HOSPITAL_COMMUNITY): Payer: BC Managed Care – PPO | Admitting: Physician Assistant

## 2022-04-11 ENCOUNTER — Telehealth (HOSPITAL_BASED_OUTPATIENT_CLINIC_OR_DEPARTMENT_OTHER): Payer: Self-pay | Admitting: *Deleted

## 2022-04-11 NOTE — Telephone Encounter (Signed)
-----   Message from Roland Earl sent at 04/11/2022  1:35 PM EST ----- Regarding: Echocardiogram We have made several attempts to contact this patient including sending a letter to schedule or reschedule their echocardiogram. We will be removing the patient from the echo WQ.    Thank you

## 2022-04-17 ENCOUNTER — Encounter (HOSPITAL_COMMUNITY): Payer: Self-pay | Admitting: Physician Assistant

## 2022-04-17 ENCOUNTER — Ambulatory Visit (HOSPITAL_COMMUNITY)
Admission: RE | Admit: 2022-04-17 | Discharge: 2022-04-17 | Disposition: A | Discharge status: Home / Self Care | Payer: BC Managed Care – PPO | Source: Ambulatory Visit | Attending: Physician Assistant | Admitting: Physician Assistant

## 2022-04-17 VITALS — BP 150/100 | HR 66 | Ht 59.0 in | Wt 161.0 lb

## 2022-04-17 DIAGNOSIS — Z79899 Other long term (current) drug therapy: Secondary | ICD-10-CM | POA: Diagnosis not present

## 2022-04-17 DIAGNOSIS — I480 Paroxysmal atrial fibrillation: Secondary | ICD-10-CM | POA: Diagnosis not present

## 2022-04-17 DIAGNOSIS — Q244 Congenital subaortic stenosis: Secondary | ICD-10-CM | POA: Insufficient documentation

## 2022-04-17 DIAGNOSIS — Z8249 Family history of ischemic heart disease and other diseases of the circulatory system: Secondary | ICD-10-CM | POA: Diagnosis not present

## 2022-04-17 DIAGNOSIS — I10 Essential (primary) hypertension: Secondary | ICD-10-CM | POA: Diagnosis not present

## 2022-04-17 DIAGNOSIS — Z7901 Long term (current) use of anticoagulants: Secondary | ICD-10-CM | POA: Insufficient documentation

## 2022-04-17 NOTE — Patient Instructions (Signed)
Echo and stress test scheduling [phone]

## 2022-04-17 NOTE — Progress Notes (Signed)
Primary Care Physician: Patient, No Pcp Per Primary Cardiologist: Dr Oval Linsey Primary Electrophysiologist: none Referring Physician: Dr Adah Salvage Whitney Juarez is a 57 y.o. female with a history of HTN, subaortic stenosis, AI, atrial fibrillation who presents for follow up in the [facility]. Seen by Roderic Palau NP remotely. Patient presented to the ED 01/04/21 with sudden onset palpitations and chest pain. ECG showed afib with RVR. She was started on IV diltiazem and converted to SR. She reports that she had ran out of her medications. She has had episodes of afib in the past with missed doses of diltiazem. Patient is on Eliquis for a CHADS2VASC score of 2.   Patient presented to the ED 03/12/21 with acute chest pain and palpitations. She was found to be in afib with RVR. She was started on a diltiazem gtt which converted her to SR. At discharge, her diltiazem was changed to metoprolol. She was started on flecainide 03/22/21.  An interpreter was used at today's visit. On follow up today, patient reports that she has done well with no palpitations or dizziness. She remains in SR. No bleeding issues on anticoagulation.   Today, she denies symptoms of palpitations, chest pain, shortness of breath, orthopnea, PND, lower extremity edema, presyncope, syncope, snoring, daytime somnolence, bleeding, or neurologic sequela. The patient is tolerating medications without difficulties and is otherwise without complaint today.    Atrial Fibrillation Risk Factors:  she does not have symptoms or diagnosis of sleep apnea. she does not have a history of rheumatic fever. she does not have a history of alcohol use. The patient does have a history of early familial atrial fibrillation or other arrhythmias. Father and brother have afib.  she has a BMI of Body mass index is 32.52 kg/m.Marland Kitchen Filed Weights   04/17/22 1516  Weight: 73 kg    Family History  Problem  Relation Age of Onset   Heart disease Brother      Atrial Fibrillation Management history:  Previous antiarrhythmic drugs: flecainide  Previous cardioversions: 08/2020, 10/2020 Previous ablations: none CHADS2VASC score: 2 Anticoagulation history: Eliquis   Past Medical History:  Diagnosis Date   Aortic regurgitation 04/20/2016   Atrial fibrillation ([place]) 05/27/2015   Hypertension 05/27/2015   Severe aortic stenosis 03/12/2016   Subaortic stenosis 05/26/2016   Past Surgical History:  Procedure Laterality Date   BUBBLE STUDY  06/10/2020   Procedure: BUBBLE STUDY;  Surgeon: Fay Records, MD;  Location: [facility];  Service: Cardiovascular;;   CESAREAN SECTION     TEE WITHOUT CARDIOVERSION N/A 03/27/2016   Procedure: TRANSESOPHAGEAL ECHOCARDIOGRAM (TEE);  Surgeon: Skeet Latch, MD;  Location: [facility];  Service: Cardiovascular;  Laterality: N/A;   TEE WITHOUT CARDIOVERSION N/A 06/10/2020   Procedure: TRANSESOPHAGEAL ECHOCARDIOGRAM (TEE);  Surgeon: Fay Records, MD;  Location[facility];  Service: Cardiovascular;  Laterality: N/A;    Current Outpatient Medications  Medication Sig Dispense Refill   amLODipine (NORVASC) 5 MG tablet Take by mouth.     ELIQUIS 5 MG TABS tablet TAKE 1 TABLET BY MOUTH TWICE A DAY 60 tablet 5   flecainide (TAMBOCOR) 50 MG tablet TAKE 1 TABLET BY MOUTH TWICE A DAY 180 tablet 1   ibuprofen (ADVIL) 400 MG tablet Take 400 mg by mouth every 6 (six) hours as needed for mild pain or moderate pain.     metoprolol succinate (TOPROL XL) 50 MG 24 hr tablet Take 1 tablet (50 mg total) by mouth daily. Take  with or immediately following a meal. 90 tablet 1   No current facility-administered medications for this encounter.    Allergies  Allergen Reactions   Acetaminophen Nausea Only and Other (See Comments)    Dizziness, headache    Social History   Socioeconomic History   Marital status: Married    Spouse name: Not on file   Number of children: Not on  file   Years of education: Not on file   Highest education level: Not on file  Occupational History   Not on file  Tobacco Use   Smoking status: Never   Smokeless tobacco: Never   Tobacco comments:    Never smoke 10/02/21  Substance and Sexual Activity   Alcohol use: No   Drug use: No   Sexual activity: Not on file  Other Topics Concern   Not on file  Social History Narrative   Not on file   Social Determinants of Health   Financial Resource Strain: Not on file  Food Insecurity: Not on file  Transportation Needs: Not on file  Physical Activity: Not on file  Stress: Not on file  Social Connections: Not on file  Intimate Partner Violence: Not on file     ROS- All systems are reviewed and negative except as per the HPI above.  Physical Exam: Vitals:   04/17/22 1516  BP: (!) 150/100  Pulse: 66  Weight: 73 kg  Height: '4\' 11"'$  (1.499 m)    GEN- The patient is a well appearing female, alert and oriented x 3 today.   HEENT-head normocephalic, atraumatic, sclera clear, conjunctiva pink, hearing intact, trachea midline. Lungs- Clear to ausculation bilaterally, normal work of breathing Heart- Regular rate and rhythm, no rubs or gallops, 3/6 murmur   GI- soft, NT, ND, + BS Extremities- no clubbing, cyanosis, or edema MS- no significant deformity or atrophy Skin- no rash or lesion Psych- euthymic mood, full affect Neuro- strength and sensation are intact   Wt Readings from Last 3 Encounters:  04/17/22 73 kg  10/02/21 73.1 kg  07/04/21 73.5 kg    EKG today demonstrates  SR Vent. rate 66 BPM PR interval 162 ms QRS duration 82 ms QT/QTcB 424/444 ms  Echo 03/13/21 demonstrated   1. Left ventricular ejection fraction, by estimation, is 60 to 65%. The  left ventricle has normal function.   2. Aortic valve leaflets seem minimally restricted. Elevated transaortic  velocity may be secondary to narrow outflor. Consider repeat TEE/ cardiac MRI for further evaluation. There  is mild calcification of the aortic valve. There is mild thickening of the aortic valve. Aortic valve regurgitation is mild. Aortic regurgitation PHT measures 319 msec. Aortic valve area, by VTI measures 0.63 cm. Aortic valve mean gradient measures 35.0 mmHg. Aortic valve Vmax measures 3.79  m/s.   Epic records are reviewed at length today  CHA2DS2-VASc Score = 2  The patient's score is based upon: CHF History: 0 HTN History: 1 Diabetes History: 0 Stroke History: 0 Vascular Disease History: 0 Age Score: 0 Gender Score: 1        ASSESSMENT AND PLAN: 1. Paroxysmal Atrial Fibrillation (ICD10:  I48.0) The patient's CHA2DS2-VASc score is 2, indicating a 2.2% annual risk of stroke.   Patient appears to be maintaining SR.  Continue flecainide 50 mg BID. Will rescheduled TST (she was ill and did not complete the test when it was originally scheduled).  Continue Eliquis 5 mg BID Continue metoprolol 50 mg daily  2. 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   3. HTN Mildly elevated today, has been better controlled at previous visits. No change today, continue to monitor.    Follow up with Dr Oval Linsey as scheduled. AF clinic in 6 months.    [facility] [address] [place], [place] [address] [phone] 04/17/2022 3:54 PM

## 2022-06-11 ENCOUNTER — Other Ambulatory Visit (HOSPITAL_COMMUNITY): Payer: Self-pay | Admitting: Physician Assistant

## 2022-06-22 ENCOUNTER — Other Ambulatory Visit: Payer: Self-pay | Admitting: Cardiovascular Disease

## 2022-06-22 DIAGNOSIS — I480 Paroxysmal atrial fibrillation: Secondary | ICD-10-CM

## 2022-06-22 NOTE — Telephone Encounter (Signed)
Prescription refill request for Eliquis received. Indication: Afib  Last office visit: 04/17/22 Charlean Merl)  Scr: 0.59 (03/13/21)  Age: 57 Weight: 73kg  Labs overdue - pt has scheduled appt with Dr Duke Salvia on 10/09/22. Note placed on appt to confirm pt has labs drawn at appt.   Appropriate dose. Refill sent.

## 2022-06-22 NOTE — Telephone Encounter (Signed)
Please review for refill. Thank you! 

## 2022-06-24 ENCOUNTER — Other Ambulatory Visit: Payer: Self-pay | Admitting: Cardiovascular Disease

## 2022-06-25 NOTE — Telephone Encounter (Signed)
Rx(s) sent to pharmacy electronically.  

## 2022-10-09 ENCOUNTER — Ambulatory Visit (HOSPITAL_BASED_OUTPATIENT_CLINIC_OR_DEPARTMENT_OTHER): Payer: BC Managed Care – PPO | Admitting: Cardiovascular Disease

## 2022-11-29 ENCOUNTER — Other Ambulatory Visit (HOSPITAL_COMMUNITY): Payer: Self-pay | Admitting: Physician Assistant

## 2022-12-14 ENCOUNTER — Other Ambulatory Visit (HOSPITAL_COMMUNITY): Payer: Self-pay | Admitting: *Deleted

## 2022-12-14 MED ORDER — FLECAINIDE ACETATE 50 MG PO TABS: 50.0000 mg | ORAL_TABLET | Freq: Two times a day (BID) | ORAL | 0 refills | Status: DC

## 2022-12-19 ENCOUNTER — Other Ambulatory Visit (HOSPITAL_COMMUNITY): Payer: Self-pay | Admitting: *Deleted

## 2022-12-25 ENCOUNTER — Other Ambulatory Visit: Payer: Self-pay | Admitting: Cardiovascular Disease

## 2022-12-25 DIAGNOSIS — I480 Paroxysmal atrial fibrillation: Secondary | ICD-10-CM

## 2022-12-25 NOTE — Telephone Encounter (Signed)
Prescription refill request for Eliquis received. Indication:afib Last office visit:3/24 ION:GEXBM labs Age: 57 Weight:73  kg  Prescription refilled

## 2022-12-25 NOTE — Telephone Encounter (Signed)
 Eliquis refill

## 2023-01-10 ENCOUNTER — Other Ambulatory Visit (HOSPITAL_COMMUNITY): Payer: Self-pay | Admitting: Physician Assistant

## 2023-01-25 ENCOUNTER — Telehealth: Payer: Self-pay | Admitting: Cardiovascular Disease

## 2023-01-25 ENCOUNTER — Other Ambulatory Visit: Payer: Self-pay | Admitting: Cardiovascular Disease

## 2023-01-25 DIAGNOSIS — I480 Paroxysmal atrial fibrillation: Secondary | ICD-10-CM

## 2023-01-25 NOTE — Telephone Encounter (Signed)
*  STAT* If patient is at the pharmacy, call can be transferred to refill team.   1. Which medications need to be refilled? (please list name of each medication and dose if known)   apixaban (ELIQUIS) 5 MG TABS tablet    2. Which pharmacy/location (including street and city if local pharmacy) is medication to be sent to? [id number] - [place], [place] - [address], [address] AT [facility] [address]    3. Do they need a 30 day or 90 day supply? 30 day

## 2023-01-25 NOTE — Telephone Encounter (Signed)
 Prescription refill request for Eliquis received. Indication:afib Last office visit:3/24 ION:GEXBM labs Age: 57 Weight:73  kg  Prescription refilled

## 2023-01-28 ENCOUNTER — Other Ambulatory Visit: Payer: Self-pay

## 2023-01-28 DIAGNOSIS — I480 Paroxysmal atrial fibrillation: Secondary | ICD-10-CM

## 2023-01-28 MED ORDER — APIXABAN 5 MG PO TABS
5.0000 mg | ORAL_TABLET | Freq: Two times a day (BID) | ORAL | 1 refills | Status: DC
Start: 2023-01-28 — End: 2023-02-10

## 2023-02-06 ENCOUNTER — Other Ambulatory Visit (HOSPITAL_COMMUNITY): Payer: Self-pay | Admitting: Physician Assistant

## 2023-02-10 ENCOUNTER — Other Ambulatory Visit: Payer: Self-pay | Admitting: Student

## 2023-02-10 ENCOUNTER — Other Ambulatory Visit: Payer: Self-pay | Admitting: Cardiovascular Disease

## 2023-02-10 DIAGNOSIS — I480 Paroxysmal atrial fibrillation: Secondary | ICD-10-CM

## 2023-02-10 MED ORDER — APIXABAN 5 MG PO TABS: 5.0000 mg | ORAL_TABLET | Freq: Two times a day (BID) | ORAL | 1 refills | Status: DC

## 2023-04-06 ENCOUNTER — Other Ambulatory Visit: Payer: Self-pay | Admitting: Student

## 2023-04-06 DIAGNOSIS — I480 Paroxysmal atrial fibrillation: Secondary | ICD-10-CM

## 2023-04-08 NOTE — Telephone Encounter (Signed)
 Prescription refill request for Eliquis received. Indication:afib Last office visit:3/24 WUJ:[id number] labs Age:58  Weight:73  kg  Prescription refilled

## 2023-05-08 ENCOUNTER — Other Ambulatory Visit (HOSPITAL_COMMUNITY): Payer: Self-pay | Admitting: Physician Assistant

## 2023-05-12 ENCOUNTER — Telehealth: Payer: Self-pay | Admitting: Student

## 2023-05-12 ENCOUNTER — Other Ambulatory Visit: Payer: Self-pay | Admitting: Student

## 2023-05-12 DIAGNOSIS — I480 Paroxysmal atrial fibrillation: Secondary | ICD-10-CM

## 2023-05-12 MED ORDER — APIXABAN 5 MG PO TABS: 5.0000 mg | ORAL_TABLET | Freq: Two times a day (BID) | ORAL | 0 refills | Status: DC

## 2023-05-12 NOTE — Telephone Encounter (Signed)
 Patients daughter Nolon Stalls called the after hour call line because her mom had run out of refills for the eliquis 5 mg twice daily. Called and spoke to the patients daughter as the patient speaks only spanish. Patient denied having hematuria, melena or hematochezia. Per chart review, patient is overdue for follow-up (last OV 04/2022 with afib clinic) but has appointment with Dr. Duke Salvia coming up. Sent in a refill for 30 days and encouraged to patient to go to [facility] tomorrow am to get labs drawn as they are overdue as well. She has lab orders already in from 12/2022 (BMET, CBC). Patient has a follow up on 4/24 with Dr. Duke Salvia, reminded of [facility] location. Discussed that no future refills refills would be provided if these appointments are not kept.

## 2023-05-13 LAB — CBC WITH DIFFERENTIAL/PLATELET

## 2023-05-14 LAB — BASIC METABOLIC PANEL WITH GFR
BUN/Creatinine Ratio: 19 (ref 9–23)
BUN: 12 mg/dL (ref 6–24)
CO2: 21 mmol/L (ref 20–29)
Calcium: 9.5 mg/dL (ref 8.7–10.2)
Chloride: 103 mmol/L (ref 96–106)
Creatinine, Ser: 0.62 mg/dL (ref 0.57–1.00)
Glucose: 106 mg/dL — ABNORMAL HIGH (ref 70–99)
Potassium: 4.3 mmol/L (ref 3.5–5.2)
Sodium: 141 mmol/L (ref 134–144)
eGFR: 104 mL/min/{1.73_m2} (ref >59)

## 2023-05-14 LAB — CBC WITH DIFFERENTIAL/PLATELET
Basos: 1 %
EOS (ABSOLUTE): 0.1 10*3/uL (ref 0.0–0.2)
Eos: 3 %
Hematocrit: 41.1 % (ref 34.0–46.6)
Hemoglobin: 13.7 g/dL (ref 11.1–15.9)
Immature Granulocytes: 0 %
Immature Granulocytes: 0 10*3/uL (ref 0.0–0.1)
Lymphs: 31 %
MCH: 29.1 pg (ref 26.6–33.0)
MCHC: 33.3 g/dL (ref 31.5–35.7)
MCV: 87 fL (ref 79–97)
Monocytes Absolute: 0.2 10*3/uL (ref 0.0–0.4)
Monocytes Absolute: 0.6 10*3/uL (ref 0.1–0.9)
Monocytes: 7 %
Neutrophils Absolute: 2.6 10*3/uL (ref 0.7–3.1)
Neutrophils Absolute: 4.8 10*3/uL (ref 1.4–7.0)
Neutrophils: 58 %
Platelets: 241 10*3/uL (ref 150–450)
RBC: 4.7 x10E6/uL (ref 3.77–5.28)
RDW: 13 % (ref 11.7–15.4)
WBC: 8.3 10*3/uL (ref 3.4–10.8)

## 2023-05-30 ENCOUNTER — Ambulatory Visit (HOSPITAL_BASED_OUTPATIENT_CLINIC_OR_DEPARTMENT_OTHER): Payer: BC Managed Care – PPO | Admitting: Cardiovascular Disease

## 2023-06-03 ENCOUNTER — Other Ambulatory Visit (HOSPITAL_COMMUNITY): Payer: Self-pay | Admitting: Physician Assistant

## 2023-06-11 ENCOUNTER — Telehealth: Payer: Self-pay | Admitting: Student

## 2023-06-11 DIAGNOSIS — I480 Paroxysmal atrial fibrillation: Secondary | ICD-10-CM

## 2023-06-11 MED ORDER — APIXABAN 5 MG PO TABS: 5.0000 mg | ORAL_TABLET | Freq: Two times a day (BID) | ORAL | 0 refills | Status: DC

## 2023-06-11 NOTE — Telephone Encounter (Signed)
  Patient daughter (Whitney Juarez) called after-hours answering service requesting refill of Eliquis . She called earlier this month on 05/12/2023. She was overdue for annual follow-up but denied any abnormal bleeding and had an appointment with Dr. Theodis Fiscal scheduled for 05/30/2023. Therefore, she was given a one month supply and encouraged to come in for repeat labs and keep the appointment with Dr. Theodis Fiscal. She did come in for repeat labs and hemoglobin and creatinine were normal. She unfortunately had to cancel her appointment with Dr. Theodis Fiscal but has one rescheduled with Neomi Banks, NP, later this week on 06/14/2023. Therefore, I will provide another 30 day supply of Eliquis  but emphasized the importance of keeping her appointment later this week in order for us  to provide additional refills. Daughter voiced understanding and agreed.  Cabell Lazenby E Azarria Balint, PA-C 06/11/2023 6:56 PM

## 2023-06-12 ENCOUNTER — Telehealth (HOSPITAL_BASED_OUTPATIENT_CLINIC_OR_DEPARTMENT_OTHER): Payer: Self-pay

## 2023-06-12 NOTE — Telephone Encounter (Addendum)
 Called pt with spanish interpreter ID [phone], Called results to patient and left results on VM (ok per DPR), instructions left to call office back if patient has any questions!     ----- Message from [facility] sent at 06/12/2023 11:15 AM EDT ----- Kidney function and electrolytes are normal.  Blood counts are normal.

## 2023-06-14 ENCOUNTER — Ambulatory Visit (INDEPENDENT_AMBULATORY_CARE_PROVIDER_SITE_OTHER): Payer: PRIVATE HEALTH INSURANCE | Admitting: Family

## 2023-06-14 ENCOUNTER — Encounter (HOSPITAL_BASED_OUTPATIENT_CLINIC_OR_DEPARTMENT_OTHER): Payer: Self-pay | Admitting: Family

## 2023-06-14 VITALS — BP 136/82 | HR 65 | Ht 59.0 in | Wt 170.4 lb

## 2023-06-14 DIAGNOSIS — D6859 Other primary thrombophilia: Secondary | ICD-10-CM

## 2023-06-14 DIAGNOSIS — I351 Nonrheumatic aortic (valve) insufficiency: Secondary | ICD-10-CM | POA: Diagnosis not present

## 2023-06-14 DIAGNOSIS — Q244 Congenital subaortic stenosis: Secondary | ICD-10-CM

## 2023-06-14 DIAGNOSIS — M7989 Other specified soft tissue disorders: Secondary | ICD-10-CM

## 2023-06-14 DIAGNOSIS — Z79899 Other long term (current) drug therapy: Secondary | ICD-10-CM

## 2023-06-14 DIAGNOSIS — I10 Essential (primary) hypertension: Secondary | ICD-10-CM

## 2023-06-14 DIAGNOSIS — I480 Paroxysmal atrial fibrillation: Secondary | ICD-10-CM

## 2023-06-14 MED ORDER — APIXABAN 5 MG PO TABS: 5.0000 mg | ORAL_TABLET | Freq: Two times a day (BID) | ORAL | 3 refills | Status: AC

## 2023-06-14 MED ORDER — FLECAINIDE ACETATE 50 MG PO TABS: 50.0000 mg | ORAL_TABLET | Freq: Two times a day (BID) | ORAL | 1 refills | Status: DC

## 2023-06-14 MED ORDER — METOPROLOL SUCCINATE ER 50 MG PO TB24: 50.0000 mg | ORAL_TABLET | Freq: Every day | ORAL | 1 refills | Status: DC

## 2023-06-14 NOTE — Progress Notes (Signed)
 Cardiology Office Note:  .   Date:  06/14/2023  ID:  Algis Ingles, DOB 11-Jun-1965, MRN [id number] PCP: Patient, No Pcp Per  [facility] Providers Cardiologist:  Maudine Sos, MD    History of Present Illness: .   Whitney Juarez is a 58 y.o. female with hx of PAF, HTN, subaortic stenosis/aortic insufficiency/sub aortic membranes.  Underwent TEE 06/06/2020 with LVEF 60-65%, bubble study with small few bubbles seen late consistent with intrapulmonary shunting and 1 large bubble Singley felt to be consistent with intrapulmonary shunt.  No obvious PFO noted.  Trivial MR, respiratory aortic valve motion is systolic doming.  The right and noncoronary cusps appear to be more tethered at the base.  Turbulent flow seen through the valve.  Unable to align with jet to get accurate gradients.  Aortic regurgitation mild to moderate.  Referred to CT surgery who recommended surgical intervention only if heart failure symptoms noted.    Most recent echo 03/13/2021 normal LVEF 60 to 65%, aortic valve leaflets minimally restricted, mild AI.  Last seen by general cardiology 07/2020.  Since that time has seen A-fib clinic intermittently with last visit 04/17/2022.  She was recommended ETT due to flecainide  utilization as well as updated echocardiogram.  Neither was ever performed.  Presents today for follow-up with her daughter.  Visit assisted by interpreter.  She is milliner taking amlodipine the notes BP at home routinely in the 120s, will not resume. Reports no shortness of breath nor dyspnea on exertion. Reports no chest pain, pressure, or tightness. No edema, orthopnea, PND. Reports no palpitations.  Reviewed indication for ETT as well as echocardiogram, she is willing to schedule. She does note swollen mass on her right upper arm that has been present for years but more recently causing some discomfort with motion. No pain with palpation. No prior imaging. Verbalizes desire to establish with  PCP.   Lab Results  Component Value Date   WBC 8.3 05/13/2023   HGB 13.7 05/13/2023   HCT 41.1 05/13/2023   MCV 87 05/13/2023   PLT 241 05/13/2023    Lab Results  Component Value Date   NA 141 05/13/2023   K 4.3 05/13/2023   CO2 21 05/13/2023   GLUCOSE 106 (H) 05/13/2023   BUN 12 05/13/2023   CREATININE 0.62 05/13/2023   CALCIUM  9.5 05/13/2023   EGFR 104 05/13/2023   GFRNONAA >60 03/13/2021    ROS: Please see the history of present illness.    All other systems reviewed and are negative.   Studies Reviewed: Aaron Aas   EKG Interpretation Date/Time:  Friday Jun 14 2023 10:06:50 EDT Ventricular Rate:  65 PR Interval:  136 QRS Duration:  80 QT Interval:  408 QTC Calculation: 424 R Axis:   13  Text Interpretation: Normal sinus rhythm with sinus arrhythmia  Stable inferior, lateral T wave abnormality. Confirmed by Neomi Banks ([id number]) on 06/14/2023 10:16:24 AM    Cardiac Studies & Procedures   ______________________________________________________________________________________________     ECHOCARDIOGRAM  ECHOCARDIOGRAM LIMITED 03/13/2021  Narrative ECHOCARDIOGRAM LIMITED REPORT    Patient Name:   Whitney [facility] Date of Exam: 03/13/2021 Medical Rec #:  [id number]              Height:       59.0 in Accession #:    [id number]             Weight:       153.0 lb Date of Birth:  03/11/1965  BSA:          1.646 m Patient Age:    55 years               BP:           139/90 mmHg Patient Gender: F                      HR:           82 bpm. Exam Location:  Inpatient  Procedure: Limited Echo, Cardiac Doppler and Color Doppler  Indications:    A-fib  History:        Patient has prior history of Echocardiogram examinations, most recent 01/04/2021. Aortic Valve Disease; Risk Factors:Hypertension.  Sonographer:    Delford Felling MHA, RDMS, RVT, RDCS Referring Phys: [id number] JULIE MACHEN  IMPRESSIONS   1. Left ventricular ejection fraction, by  estimation, is 60 to 65%. The left ventricle has normal function. 2. Aortic valve leaflets seem minimally restricted. Elevated transaortic velocity may be secondary to narrow outflor. Consider repeat TEE/ cardiac MRI for further evaluation. There is mild calcification of the aortic valve. There is mild thickening of the aortic valve. Aortic valve regurgitation is mild. Aortic regurgitation PHT measures 319 msec. Aortic valve area, by VTI measures 0.63 cm. Aortic valve mean gradient measures 35.0 mmHg. Aortic valve Vmax measures 3.79 m/s.  FINDINGS Left Ventricle: Left ventricular ejection fraction, by estimation, is 60 to 65%. The left ventricle has normal function.  Left Atrium: Left atrial size was normal in size.  Right Atrium: Right atrial size was normal in size.  Aortic Valve: Aortic valve leaflets seem minimally restricted. Elevated transaortic velocity may be secondary to narrow outflor. Consider repeat TEE/ cardiac MRI for further evaluation. There is mild calcification of the aortic valve. There is mild thickening of the aortic valve. Aortic valve regurgitation is mild. Aortic regurgitation PHT measures 319 msec. Aortic valve mean gradient measures 35.0 mmHg. Aortic valve peak gradient measures 57.5 mmHg. Aortic valve area, by VTI measures 0.63 cm.  Aorta: The aortic root is normal in size and structure.  LEFT VENTRICLE PLAX 2D LVIDd:         4.30 cm LVIDs:         2.90 cm LV PW:         0.90 cm LV IVS:        0.80 cm LVOT diam:     1.55 cm LV SV:         49 LV SV Index:   30 LVOT Area:     1.89 cm   LEFT ATRIUM           Index LA diam:      3.30 cm 2.00 cm/m LA Vol (A4C): 40.1 ml 24.36 ml/m AORTIC VALVE AV Area (Vmax):    0.67 cm AV Area (Vmean):   0.65 cm AV Area (VTI):     0.63 cm AV Vmax:           379.00 cm/s AV Vmean:          279.500 cm/s AV VTI:            0.777 m AV Peak Grad:      57.5 mmHg AV Mean Grad:      35.0 mmHg LVOT Vmax:         135.00  cm/s LVOT Vmean:        95.900 cm/s LVOT VTI:  0.261 m LVOT/AV VTI ratio: 0.34 AI PHT:            319 msec  AORTA Ao Root diam: 2.70 cm   SHUNTS Systemic VTI:  0.26 m Systemic Diam: 1.55 cm  Dorothye Gathers MD Electronically signed by Dorothye Gathers MD Signature Date/Time: 03/13/2021/1:48:03 PM    Final   TEE  ECHO TEE 06/10/2020  Narrative TRANSESOPHOGEAL ECHO REPORT    Patient Name:   Rosio MARIA PINEDA-AVILA Date of Exam: 06/10/2020 Medical Rec #:  [id number]              Height:       59.0 in Accession #:    [id number]             Weight:       164.0 lb Date of Birth:  [birth year]               BSA:          1.695 m Patient Age:    55 years               BP:           144/66 mmHg Patient Gender: F                      HR:           109 bpm. Exam Location:  Inpatient  Procedure: Transesophageal Echo, 3D Echo, Color Doppler and Cardiac Doppler  Indications:     subaortic membranes  History:         Patient has prior history of Echocardiogram examinations, most recent 11/23/2019. Aortic Valve Disease, Arrythmias:Atrial Fibrillation; Risk Factors:Hypertension.  Sonographer:     Erminia Hazel Referring Phys:  [id number] JESSE M CLEAVER Diagnosing Phys: Ola Berger MD  PROCEDURE: After discussion of the risks and benefits of a TEE, an informed consent was obtained from the patient. The transesophogeal probe was passed without difficulty through the esophogus of the patient. Sedation performed by different physician. The patient was monitored while under deep sedation. Anesthestetic sedation was provided intravenously by Anesthesiology: 510mg  of Propofol . The patient developed no complications during the procedure.  IMPRESSIONS   1. Small subaortic ridge noted anteriorly (3 mm). Left ventricular ejection fraction, by estimation, is 60 to 65%. The left ventricle has normal function. 2. Right ventricular systolic function is normal. The right ventricular size is normal. 3.  No left atrial/left atrial appendage thrombus was detected. 4. Few small bubbles seen late consistent with some intrapulmonary shunting. ONe larger bubble seen late, again, most likely intrapulmonary shunt. No obvious PFO. 5. The mitral valve is normal in structure. Trivial mitral valve regurgitation. 6. The aortic valve is trileaflet There is some restricted motion with systolic doming. The right and noncoronary cusps appear to be more tethered at their base. Turbulent flow seen through the valve. Unable to align with jet to get accurate gradients . Aortic valve regurgitation is mild to moderate.  FINDINGS Left Ventricle: Small subaortic ridge noted anteriorly 3 mm. Left ventricular ejection fraction, by estimation, is 60 to 65%. The left ventricle has normal function. The left ventricular internal cavity size was normal in size.  Right Ventricle: The right ventricular size is normal. Right ventricular systolic function is normal.  Left Atrium: Left atrial size was normal in size. No left atrial/left atrial appendage thrombus was detected.  Right Atrium: Right atrial size was normal in size.  Pericardium: There is no evidence of pericardial effusion.  Mitral Valve: The mitral valve is normal in structure. Trivial mitral valve regurgitation.  Tricuspid Valve: The tricuspid valve is normal in structure. Tricuspid valve regurgitation is trivial.  Aortic Valve: The aortic valve is trileaflet There is some restricted motion with systolic doming. The right and noncoronary cusps appear to be more tethered at they base. Turbulent flow seen through the valve. Unable to align with jet to get accurate gradients. The aortic valve is tricuspid. Aortic valve regurgitation is mild to moderate.  Pulmonic Valve: The pulmonic valve was normal in structure. Pulmonic valve regurgitation is not visualized.  Aorta: The aortic root is normal in size and structure. There is minimal (Grade I)  plaque.  IAS/Shunts: Agitated saline contrast was given intravenously to evaluate for intracardiac shunting. Few small bubbles seen late consistent with some intrapulmonary shunting. ONe larger bubble seen late, most likely intrapulmonary shunt. No obvious PFO.  Ola Berger MD Electronically signed by Ola Berger MD Signature Date/Time: 06/10/2020/4:16:17 PM    Final    CT SCANS  CT CORONARY MORPH W/CTA COR W/SCORE 05/09/2016  Addendum 05/10/2016  1:43 PM ADDENDUM REPORT: 05/10/2016 13:41  CLINICAL DATA:  60 -year-old female with severe transaortic gradients and no obvious aortic stenosis.  EXAM: Cardiac/Coronary  CT  TECHNIQUE: The patient was scanned on a Philips 256 scanner.  FINDINGS: A 120 kV prospective scan was triggered in the descending thoracic aorta at 111 HU's. Axial non-contrast 3 mm slices were carried out through the heart. The data set was analyzed on a dedicated work station and scored using the Agatson method. Gantry rotation speed was 270 msecs and collimation was .9 mm. 10 mg of iv Metoprolol  and 0.8 mg of sl NTG was given. The 3D data set was reconstructed in 5% intervals of the 67-82 % of the R-R cycle. Diastolic phases were analyzed on a dedicated work station using MPR, MIP and VRT modes. The patient received 80 cc of contrast.  Aorta: Normal size. Trivial calcifications in the aortic arch. No dissection.  Aortic Valve: Trileaflet. No calcifications. There is a thin subaortic membrane in the anterior portion of the LVOT, located 5 mm bellow the right coronary cusp measuring 7 mm.  Coronary Arteries:  Normal coronary origin.  Right dominance.  RCA is a very large dominant artery that gives rise to PDA and PLVB. There is no plaque.  Left main is a large artery that gives rise to LAD and LCX arteries.  LAD is a large vessel that has no plaque.  D1 has no plaque.  LCX is a non-dominant artery that gives rise to one OM1 branch. There is no  plaque.  Other findings:  Normal pulmonary vein drainage into the left atrium.  Normal let atrial appendage without a thrombus.  IMPRESSION: 1. There is a thin subaortic membrane in the anterior portion of the LVOT, located 4 mm bellow the right coronary cusp measuring 7 mm (screenshot images are attached in PACS).  2. Coronary calcium  score of 0. This was 0 percentile for age and sex matched control.  3. Normal coronary origin with right dominance.  4. No evidence of CAD.  Christoper Crafts   Electronically Signed By: Christoper Crafts On: 05/10/2016 13:41  Narrative EXAM: OVER-READ INTERPRETATION  CT CHEST  The following report is an over-read performed by radiologist Dr. Selwyn [facility] on 05/09/2016. This over-read does not include interpretation of cardiac or coronary anatomy or pathology. The coronary calcium  score/coronary CTA interpretation by the cardiologist is attached.  COMPARISON:  05/27/2015 chest radiograph.  FINDINGS: Cardiovascular: Atherosclerotic nonaneurysmal thoracic aorta. Normal caliber pulmonary arteries. No central pulmonary emboli.  Mediastinum/Nodes: Unremarkable esophagus. No pathologically enlarged axillary, mediastinal or hilar lymph nodes.  Lungs/Pleura: No pneumothorax. No pleural effusion. A few scattered solid pulmonary nodules are seen in both lungs measuring up to 5 mm in the right lower lobe (series 512/ image 45). No acute consolidative airspace disease or lung masses.  Upper abdomen: Unremarkable.  Musculoskeletal:  No aggressive appearing focal osseous lesions.  IMPRESSION: 1. Scattered solid pulmonary nodules, largest 5 mm. No follow-up needed if patient is low-risk (and has no known or suspected primary neoplasm). Non-contrast chest CT can be considered in 12 months if patient is high-risk. This recommendation follows the consensus statement: Guidelines for Management of Incidental Pulmonary  Nodules Detected on CT Images: From the Fleischner Society 2017; Radiology 2017; 284:228-243. 2. Aortic atherosclerosis.  Electronically Signed: By: Levell Reach M.D. On: 05/09/2016 12:47     ______________________________________________________________________________________________      Risk Assessment/Calculations:    CHA2DS2-VASc Score = 2   This indicates a 2.2% annual risk of stroke. The patient's score is based upon: CHF History: 0 HTN History: 1 Diabetes History: 0 Stroke History: 0 Vascular Disease History: 0 Age Score: 0 Gender Score: 1             Physical Exam:   VS:  BP 136/82 (BP Location: Right Arm, Patient Position: Sitting, Cuff Size: Normal)   Pulse 65   Ht 4\' 11"  (1.499 m)   Wt 170 lb 6.4 oz (77.3 kg)   LMP  (LMP Unknown)   SpO2 97%   BMI 34.42 kg/m    Wt Readings from Last 3 Encounters:  06/14/23 170 lb 6.4 oz (77.3 kg)  04/17/22 161 lb (73 kg)  10/02/21 161 lb 3.2 oz (73.1 kg)    GEN: Well nourished, overweight,  well developed in no acute distress NECK: No JVD; No carotid bruits CARDIAC: RRR, no murmurs, rubs, gallops RESPIRATORY:  Clear to auscultation without rales, wheezing or rhonchi  ABDOMEN: Soft, non-tender, non-distended EXTREMITIES:  No edema. RUE with golf-ball sized non tender mass which is mobile  ASSESSMENT AND PLAN: .    Subaortic stenosis / Aortic regurgitation -no symptoms of heart failure.  Per previous encounters with cardiothoracic surgery plan for surgical intervention only if symptomatic.  Reports no dyspnea, chest pain, edema.  Update echocardiogram for monitoring.  PAF / Hypercoagulable state / High risk medication use -maintaining sinus rhythm by EKG.  Denies palpitations.  Reports intermittent blood in her stool, no prior diagnosis of hemorrhoids that she is aware of, CBC 05/13/23 hemoglobin 13.7, hematocrit 41.1.  As CBC unremarkable will plan for monitoring and she will contact us  if she has increased  bleeding. Continue flecainide  50 mg twice daily, Eliquis  5 mg twice daily, Toprol  50 mg daily.  Refills provided.  Does not meet dose reduction criteria for Eliquis . High risk medication use: Previously recommended for ETT due to use of flecainide  to rule out CAD but not ever performed.  Will reorder ETT and she is agreeable to schedule.  HTN - Controlled on Toprol  50 mg daily.  Refill provided. Home BP routinely 120s.  BP elevated in future consider resuming amlodipine. Discussed to monitor BP at home at least 2 hours after medications and sitting for 5-10 minutes.   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  It is golf ball size in  size, firm, mobile.  It does not cause pain on palpation but does have some tenderness with motion.  No prior evaluation.  Plan for RUE nonvascular ultrasound for further evaluation.  Encouraged to establish primary care provider.     Informed Consent   Shared Decision Making/Informed Consent The risks [chest pain, shortness of breath, cardiac arrhythmias, dizziness, blood pressure fluctuations, myocardial infarction, stroke/transient ischemic attack, and life-threatening complications (estimated to be 1 in 10,000)], benefits (risk stratification, diagnosing coronary artery disease, treatment guidance) and alternatives of an exercise tolerance test were discussed in detail with Ms. Streat and she agrees to proceed.     Dispo: follow up in 3 mos with AFib clinic and in 6 mos with Dr. Theodis Fiscal or APP  Signed, Clearnce Curia, NP

## 2023-06-14 NOTE — Patient Instructions (Addendum)
 Medication Instructions/Medicamentos Continue your current medications. Continua la misma medicamentos. *If you need a refill on your cardiac medications before your next appointment, please call your pharmacy*  Testing/Procedures:  Your physician has requested that you have an echocardiogram. Echocardiography is a painless test that uses sound waves to create images of your heart. It provides your doctor with information about the size and shape of your heart and how well your heart's chambers and valves are working. This procedure takes approximately one hour. There are no restrictions for this procedure. Please do NOT wear cologne, perfume, aftershave, or lotions (deodorant is allowed). Please arrive 15 minutes prior to your appointment time.  Please note: We ask at that you not bring children with you during ultrasound (echo/ vascular) testing. Due to room size and safety concerns, children are not allowed in the ultrasound rooms during exams. Our front office staff cannot provide observation of children in our lobby area while testing is being conducted. An adult accompanying a patient to their appointment will only be allowed in the ultrasound room at the discretion of the ultrasound technician under special circumstances. We apologize for any inconvenience.   ________  Your provider has requested you have ultrasound of your right arm for evaluation of swelling.  _______   Follow-Up/[facility], you and your health needs are our priority.  As part of our continuing mission to provide you with exceptional heart care, our providers are all part of one team.  This team includes your primary Cardiologist (physician) and Advanced Practice Providers or APPs (Physician Assistants and Nurse Practitioners) who all work together to provide you with the care you need, when you need it.  Your next appointment/[facility]: AFib clinic in 3 months (AFib clinic en tres  meses) And Dr. Theodis Fiscal or APP in 6 months (Dr. Theodis Fiscal en seis meses)  We recommend signing up for the patient portal called "MyChart".  Sign up information is provided on this After Visit Summary.  MyChart is used to connect with patients for Virtual Visits (Telemedicine).  Patients are able to view lab/test results, encounter notes, upcoming appointments, etc.  Non-urgent messages can be sent to your provider as well.   To learn more about what you can do with MyChart, go to [email or web address].   Other Instructions Exercise Treadmill instructions    Please arrive 15 minutes prior to your appointment time for registration and insurance purposes.  The test will take approximately 45 minutes to complete.  How to prepare for your Exercise Stress Test: Do bring a list of your current medications with you.  If not listed below, you may take your medications as normal. No tome metoprolol  (Lopressor , Toprol ) durante las 24 horas antes de la prueba. Traiga el medicamento a su cita, ya que es posible que tenga que tomarlo una vez que termine la prueba. Do wear comfortable clothes (no dresses or overalls) and walking shoes, tennis shoes preferred (no heels or open toed shoes are allowed) Do Not wear cologne, perfume, aftershave or lotions (deodorant is allowed).  If these instructions are not followed, your test will have to be rescheduled.  If you have questions or concerns about your appointment, you can call the Stress Lab at [phone].  If you cannot keep your appointment, please provide 24 hours notification to the Stress Lab, to avoid a possible $50 charge to your account

## 2023-06-24 ENCOUNTER — Ambulatory Visit (HOSPITAL_BASED_OUTPATIENT_CLINIC_OR_DEPARTMENT_OTHER)
Admission: RE | Admit: 2023-06-24 | Discharge: 2023-06-24 | Disposition: A | Discharge status: Home / Self Care | Payer: PRIVATE HEALTH INSURANCE | Source: Ambulatory Visit | Attending: Family | Admitting: Family

## 2023-06-24 DIAGNOSIS — M7989 Other specified soft tissue disorders: Secondary | ICD-10-CM | POA: Diagnosis present

## 2023-07-01 ENCOUNTER — Ambulatory Visit (HOSPITAL_BASED_OUTPATIENT_CLINIC_OR_DEPARTMENT_OTHER): Payer: Self-pay | Admitting: Family

## 2023-07-22 ENCOUNTER — Encounter (HOSPITAL_COMMUNITY): Payer: PRIVATE HEALTH INSURANCE

## 2023-07-22 ENCOUNTER — Ambulatory Visit (HOSPITAL_COMMUNITY)
Admission: RE | Admit: 2023-07-22 | Discharge: 2023-07-22 | Disposition: A | Discharge status: Home / Self Care | Payer: PRIVATE HEALTH INSURANCE | Source: Ambulatory Visit | Attending: Cardiology | Admitting: Cardiology

## 2023-07-22 DIAGNOSIS — Q244 Congenital subaortic stenosis: Secondary | ICD-10-CM | POA: Diagnosis present

## 2023-07-22 LAB — ECHOCARDIOGRAM COMPLETE
AR max vel: 0.83 cm2
AV Area VTI: 0.89 cm2
AV Area mean vel: 0.85 cm2
AV Mean grad: 40 mmHg
AV Peak grad: 70.2 mmHg
Ao pk vel: 4.19 m/s
Area-P 1/2: 6.02 cm2
P 1/2 time: 319 ms
S' Lateral: 2.7 cm

## 2023-07-24 ENCOUNTER — Telehealth (HOSPITAL_COMMUNITY): Payer: Self-pay | Admitting: *Deleted

## 2023-07-24 NOTE — Telephone Encounter (Signed)
 Instructions given to patient for upcoming stress test using Language Line.  Argentina Bees, RN

## 2023-07-26 NOTE — Addendum Note (Signed)
 Addended by: Ji Feldner S on: 07/26/2023 12:46 PM   Modules accepted: Orders

## 2023-07-29 ENCOUNTER — Ambulatory Visit (HOSPITAL_COMMUNITY)
Admission: RE | Admit: 2023-07-29 | Discharge: 2023-07-29 | Disposition: A | Discharge status: Home / Self Care | Payer: PRIVATE HEALTH INSURANCE | Source: Ambulatory Visit | Attending: Cardiology | Admitting: Cardiology

## 2023-07-29 DIAGNOSIS — I480 Paroxysmal atrial fibrillation: Secondary | ICD-10-CM | POA: Diagnosis not present

## 2023-07-29 DIAGNOSIS — Z79899 Other long term (current) drug therapy: Secondary | ICD-10-CM | POA: Insufficient documentation

## 2023-07-29 LAB — EXERCISE TOLERANCE TEST
Angina Index: 0
Duke Treadmill Score: 5
Estimated workload: 6.9
Exercise duration (min): 5 min
Exercise duration (sec): 0 s
MPHR: 164 {beats}/min
Peak HR: 146 {beats}/min
Percent HR: 89 %
Rest HR: 82 {beats}/min
ST Depression (mm): 0 mm

## 2023-08-06 ENCOUNTER — Encounter (HOSPITAL_COMMUNITY): Payer: PRIVATE HEALTH INSURANCE

## 2023-08-16 IMAGING — DX DG CHEST 1V PORT
1 series · 1 of 1 positions shown · non-contrast
Comparison: On 10/25/2020

CLINICAL DATA: Chest pain

EXAM:
PORTABLE CHEST 1 VIEW

[chest]
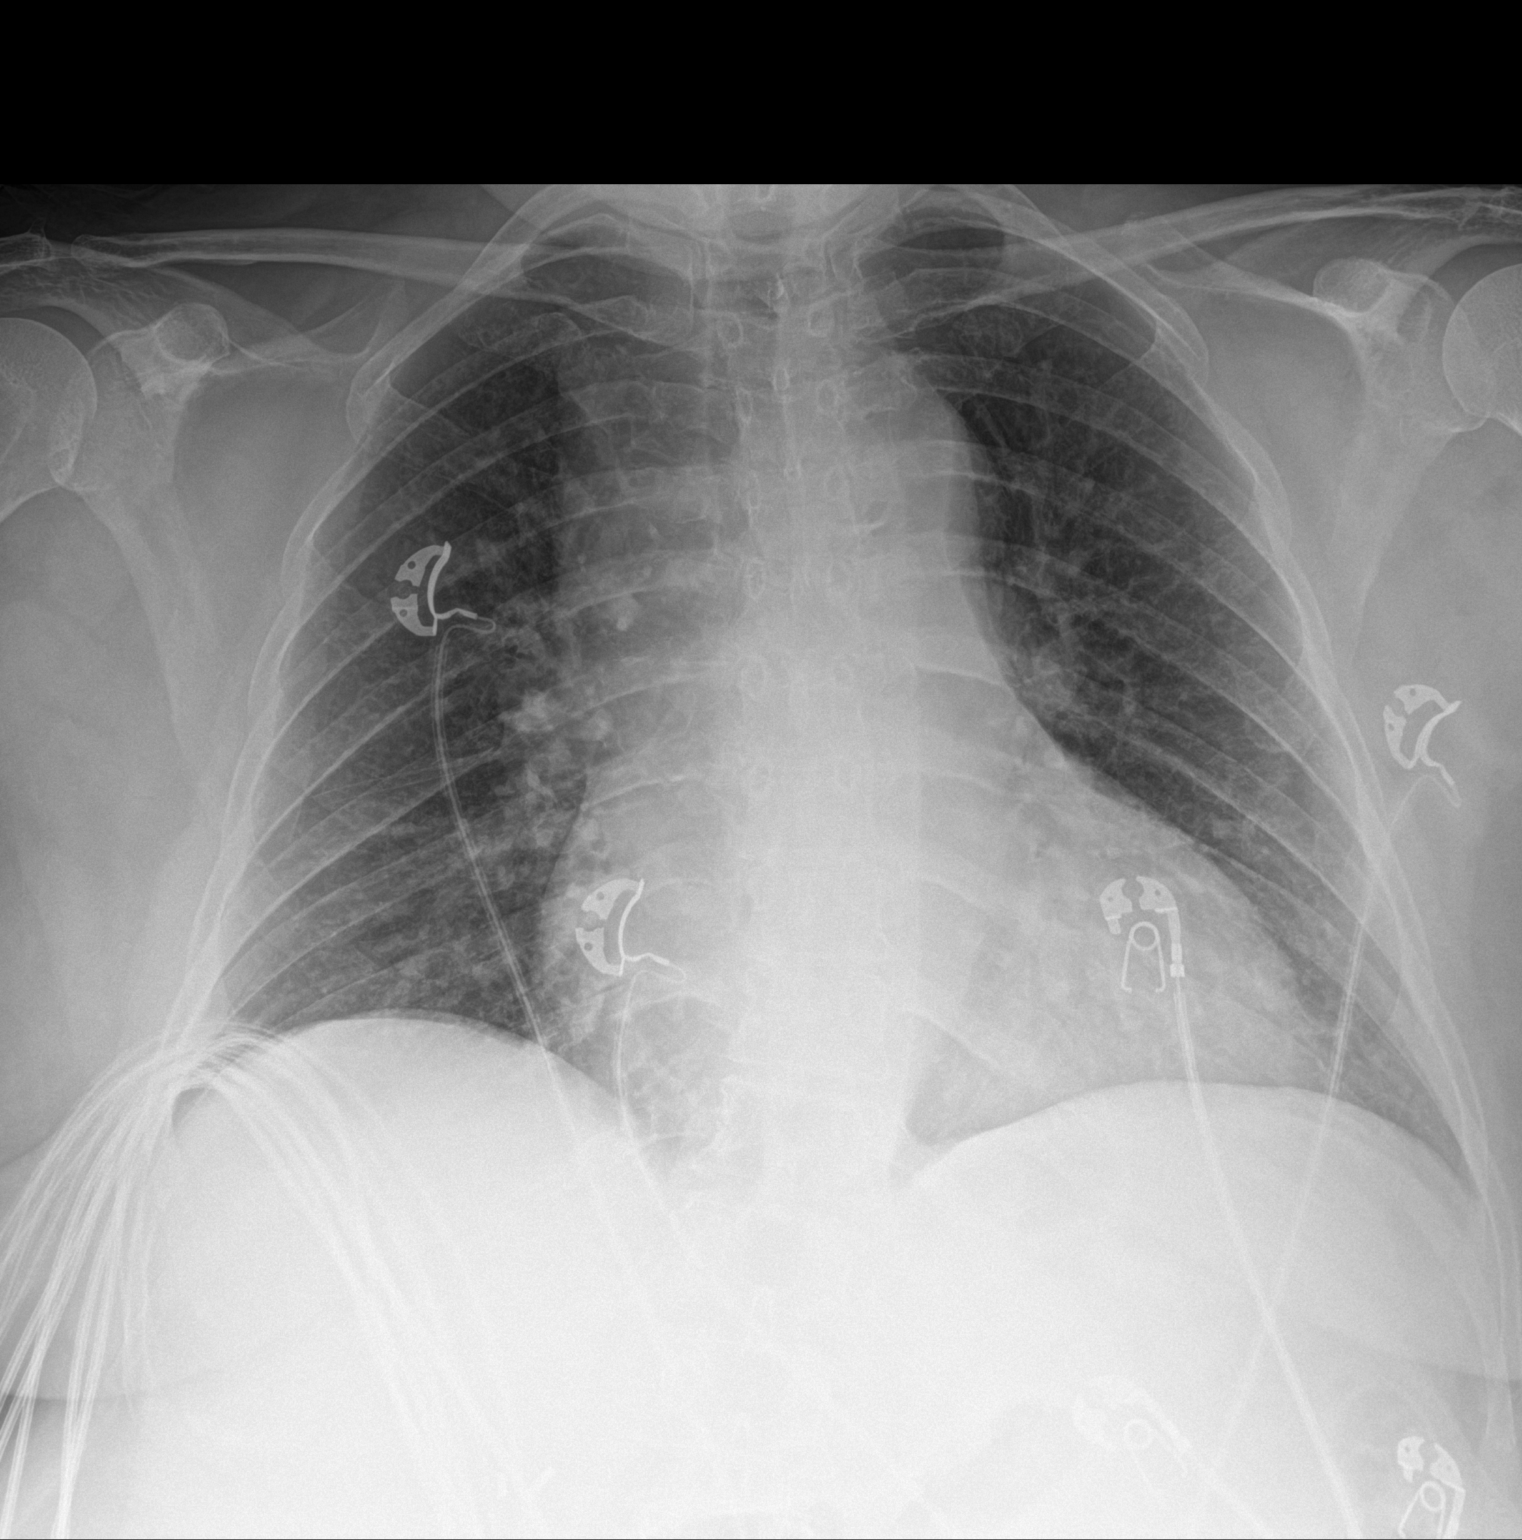

[1 of 1 positions shown; findings below may reference images not displayed]

FINDINGS: 3787 hours. The cardio pericardial silhouette is enlarged. The lungs
are clear without focal pneumonia, edema, pneumothorax or pleural
effusion. The visualized bony structures of the thorax show no acute
abnormality. Telemetry leads overlie the chest.
IMPRESSION: No active disease.

## 2023-09-16 ENCOUNTER — Encounter (HOSPITAL_COMMUNITY): Payer: Self-pay | Admitting: Physician Assistant

## 2023-09-16 ENCOUNTER — Ambulatory Visit (HOSPITAL_COMMUNITY)
Admission: RE | Admit: 2023-09-16 | Discharge: 2023-09-16 | Disposition: A | Discharge status: Home / Self Care | Payer: PRIVATE HEALTH INSURANCE | Source: Ambulatory Visit | Attending: Physician Assistant | Admitting: Physician Assistant

## 2023-09-16 VITALS — BP 146/88 | HR 71 | Ht 59.0 in | Wt 172.6 lb

## 2023-09-16 DIAGNOSIS — I480 Paroxysmal atrial fibrillation: Secondary | ICD-10-CM | POA: Diagnosis not present

## 2023-09-16 DIAGNOSIS — Z79899 Other long term (current) drug therapy: Secondary | ICD-10-CM | POA: Diagnosis not present

## 2023-09-16 DIAGNOSIS — D6859 Other primary thrombophilia: Secondary | ICD-10-CM

## 2023-09-16 DIAGNOSIS — Z5181 Encounter for therapeutic drug level monitoring: Secondary | ICD-10-CM | POA: Diagnosis not present

## 2023-09-16 NOTE — Progress Notes (Signed)
 Primary Care Physician: Patient, No Pcp Per Primary Cardiologist: Dr Raford Primary Electrophysiologist: none Referring Physician: Dr Santo Seal Whitney Juarez is a 58 y.o. female with a history of HTN, subaortic stenosis, AI, atrial fibrillation who presents for follow up in the [facility]. Seen by Arland Don NP remotely. Patient presented to the ED 01/04/21 with sudden onset palpitations and chest pain. ECG showed afib with RVR. She was started on IV diltiazem  and converted to SR. She reports that she had ran out of her medications. She has had episodes of afib in the past with missed doses of diltiazem . Patient is on Eliquis  for a CHADS2VASC score of 2.   Patient presented to the ED 03/12/21 with acute chest pain and palpitations. She was found to be in afib with RVR. She was started on a diltiazem  gtt which converted her to SR. At discharge, her diltiazem  was changed to metoprolol . She was started on flecainide  03/22/21.  Patient returns for follow up for atrial fibrillation and flecainide  monitoring. An interpreter was used for today's visit. Patient reports that she has done well with no interim symptoms of afib. No bleeding issues on anticoagulation.   Today, she  denies symptoms of palpitations, chest pain, shortness of breath, orthopnea, PND, lower extremity edema, dizziness, presyncope, syncope, snoring, daytime somnolence, bleeding, or neurologic sequela. The patient is tolerating medications without difficulties and is otherwise without complaint today.    Atrial Fibrillation Risk Factors:  she does not have symptoms or diagnosis of sleep apnea. she does not have a history of rheumatic fever. she does not have a history of alcohol use. The patient does have a history of early familial atrial fibrillation or other arrhythmias. Father and brother have afib.   Atrial Fibrillation Management history:  Previous antiarrhythmic drugs:  flecainide   Previous cardioversions: 08/2020, 10/2020 Previous ablations: none Anticoagulation history: Eliquis    Past Medical History:  Diagnosis Date   Aortic regurgitation 04/20/2016   Atrial fibrillation (HCC) 05/27/2015   Hypertension 05/27/2015   Severe aortic stenosis 03/12/2016   Subaortic stenosis 05/26/2016    Current Outpatient Medications  Medication Sig Dispense Refill   apixaban  (ELIQUIS ) 5 MG TABS tablet Take 1 tablet (5 mg total) by mouth 2 (two) times daily. 180 tablet 3   flecainide  (TAMBOCOR ) 50 MG tablet Take 1 tablet (50 mg total) by mouth 2 (two) times daily. 180 tablet 1   ibuprofen  (ADVIL ) 400 MG tablet Take 400 mg by mouth every 6 (six) hours as needed for mild pain or moderate pain.     metoprolol  succinate (TOPROL -XL) 50 MG 24 hr tablet Take 1 tablet (50 mg total) by mouth daily. Take with or immediately following a meal. 90 tablet 1   No current facility-administered medications for this encounter.    ROS- All systems are reviewed and negative except as per the HPI above.  Physical Exam: Vitals:   09/16/23 0903  BP: (!) 146/88  Pulse: 71  Weight: 78.3 kg  Height: 4' 11 (1.499 m)     GEN: Well nourished, well developed in no acute distress CARDIAC: Regular rate and rhythm, no rubs, gallops, 2-3/6 systolic murmur  RESPIRATORY:  Clear to auscultation without rales, wheezing or rhonchi  ABDOMEN: Soft, non-tender, non-distended EXTREMITIES:  No edema; No deformity    Wt Readings from Last 3 Encounters:  09/16/23 78.3 kg  06/14/23 77.3 kg  04/17/22 73 kg    EKG today demonstrates  SR Vent. rate 71 BPM  PR interval 156 ms QRS duration 76 ms QT/QTcB 406/441 ms   Echo 07/22/23 demonstrated   1. Severely elevated mean LVOT gradient (40 mmHg) likely due to subaortic  ridge noted on previous TEE; aortic valve appears to open well; mild AI.   2. Left ventricular ejection fraction, by estimation, is 60 to 65%. The  left ventricle has normal function.  The left ventricle has no regional  wall motion abnormalities. There is mild left ventricular hypertrophy.  Left ventricular diastolic parameters  were normal. The average left ventricular global longitudinal strain is  -21.5 %. The global longitudinal strain is normal.   3. Right ventricular systolic function is normal. The right ventricular  size is normal. There is normal pulmonary artery systolic pressure.   4. The mitral valve is normal in structure. Trivial mitral valve  regurgitation. No evidence of mitral stenosis.   5. The aortic valve is tricuspid. Aortic valve regurgitation is mild. No  aortic stenosis is present.   6. The inferior vena cava is normal in size with greater than 50%  respiratory variability, suggesting right atrial pressure of 3 mmHg.    Epic records are reviewed at length today   CHA2DS2-VASc Score = 2  The patient's score is based upon: CHF History: 0 HTN History: 1 Diabetes History: 0 Stroke History: 0 Vascular Disease History: 0 Age Score: 0 Gender Score: 1       ASSESSMENT AND PLAN: Paroxysmal Atrial Fibrillation (ICD10:  I48.0) The patient's CHA2DS2-VASc score is 2, indicating a 2.2% annual risk of stroke.   Patient appears to be maintaining SR Continue flecainide  50 mg BID Continue Eliquis  5 mg BID Continue Toprol  50 mg daily  High Risk Medication Monitoring (ICD 10: Z79.899) Intervals on ECG acceptable for flecainide  monitoring. TST completed 07/29/23, no QRS widening or induced arrhythmias.   VHD Subaortic membrane with mild AI Followed by Dr Raford  HTN Stable on current regimen   Follow up in the AF clinic in 6 months.    Daril Kicks PA-C [facility] [address] [place], [place] [address] [phone] 09/16/2023 9:22 AM

## 2024-01-04 ENCOUNTER — Other Ambulatory Visit (HOSPITAL_BASED_OUTPATIENT_CLINIC_OR_DEPARTMENT_OTHER): Payer: Self-pay | Admitting: Family

## 2024-01-04 DIAGNOSIS — I480 Paroxysmal atrial fibrillation: Secondary | ICD-10-CM

## 2024-01-08 ENCOUNTER — Telehealth: Payer: Self-pay | Admitting: Family

## 2024-01-08 NOTE — Telephone Encounter (Signed)
 Pt c/o medication issue:  1. Name of Medication:   apixaban  (ELIQUIS ) 5 MG TABS tablet    2. How are you currently taking this medication (dosage and times per day)? Take 1 tablet (5 mg total) by mouth 2 (two) times daily.   3. Are you having a reaction (difficulty breathing--STAT)? No   4. What is your medication issue? Pt was told by pharmacy Rx would be around $600. She would like a c/b to discuss financial assistance/grants. Pt only has two pills left. Please advise.

## 2024-01-09 ENCOUNTER — Telehealth: Payer: Self-pay | Admitting: Pharmacy Technician

## 2024-01-09 ENCOUNTER — Other Ambulatory Visit (HOSPITAL_COMMUNITY): Payer: Self-pay

## 2024-01-09 NOTE — Telephone Encounter (Signed)
 Unsure what insurance patient has so call daughter, N/A LVM

## 2024-01-09 NOTE — Telephone Encounter (Signed)
   I called [facility]    She has nurse, learning disability   Now 10.00 for 30 days

## 2024-01-09 NOTE — Telephone Encounter (Signed)
 Pt returning call. Please advise.  She ask callback be today since mom is out of medication.

## 2024-01-09 NOTE — Telephone Encounter (Signed)
 This is now 10.00. I spoke to the daughter and they are aware they can pick up today for 10.00

## 2024-02-23 ENCOUNTER — Other Ambulatory Visit (HOSPITAL_BASED_OUTPATIENT_CLINIC_OR_DEPARTMENT_OTHER): Payer: Self-pay | Admitting: Family

## 2024-02-23 DIAGNOSIS — I480 Paroxysmal atrial fibrillation: Secondary | ICD-10-CM

## 2024-02-25 ENCOUNTER — Telehealth: Payer: Self-pay | Admitting: Family

## 2024-02-25 NOTE — Telephone Encounter (Signed)
" °*  STAT* If patient is at the pharmacy, call can be transferred to refill team.   1. Which medications need to be refilled? (please list name of each medication and dose if known) metoprolol  succinate (TOPROL -XL) 50 MG 24 hr tablet    2. Would you like to learn more about the convenience, safety, & potential cost savings by using the [facility]?     3. Are you open to using the [facility] (Type [facility].  ).   4. Which pharmacy/location (including street and city if local pharmacy) is medication to be sent to? [id number] - [place], [place] - [address] AT [facility] [address]    5. Do they need a 30 day or 90 day supply? 90 day  "

## 2024-03-03 NOTE — Telephone Encounter (Signed)
 Refill was sent 02/25/24.

## 2024-03-09 ENCOUNTER — Telehealth: Payer: Self-pay | Admitting: Physician Assistant

## 2024-03-09 NOTE — Telephone Encounter (Signed)
 Paged by the patient's daughter regarding high cost of Eliquis  medication which cost about $350.  Daughter was not sure if she has met her deductible which I suspect has not.  However she also has a nurse, learning disability and may qualify for Eliquis  coupon card.  Her last dose of Eliquis  was yesterday.  Please give the patient some Eliquis  samples and Eliquis  coupon card to see if we can bring down the cost of the medication.  Her daughter's contact is [phone]

## 2024-03-10 ENCOUNTER — Other Ambulatory Visit (HOSPITAL_COMMUNITY): Payer: Self-pay

## 2024-03-10 NOTE — Telephone Encounter (Signed)
 Not positive the patient is actually insured. No pharmacy benefits pulling up.   Team, can you run a test claim for her Eliquis ?

## 2024-03-11 ENCOUNTER — Telehealth: Payer: Self-pay | Admitting: Family

## 2024-03-11 NOTE — Telephone Encounter (Signed)
 Messaged PharmD for recommendations.  Per Rx Tech yesterday in chart - pt does not have active insurance listed.  Billy Camelia BIRCH, CPhT to [place], [place], [facility]     03/10/24  3:07 PM Not able to run a test claim as insurance is not active in system ____________________________________________________  Message to Eleanor HERO., PharmD.  Awaiting reply.

## 2024-03-11 NOTE — Telephone Encounter (Signed)
 Pt daughter called in asking for samples if available for Eliquis 

## 2024-03-11 NOTE — Telephone Encounter (Signed)
 Called and left detailed message for patient/daughter [facility]) on daughter's mobile number.  Adv that [facility]quis  if that is what they are requested.  In addition, for now, since there is some concern about insurance coverage, she can also have samples for 1-2 weeks if that would help.  Adv to call back so that this can be discussed.  Awaiting input from PharmD.

## 2024-03-11 NOTE — Telephone Encounter (Signed)
 Lmtcb 2/4

## 2024-03-11 NOTE — Telephone Encounter (Signed)
 Pts daughter states pharmacy is charging $1000+ for 90 day prescription and wants to know if a 30 day prescription for Eliquis  could be sent to the pharmacy [id number] - [place], [place] - [address] [facility] [address]   Pt's daughter would like a c/b regarding this matter. Please advise.

## 2024-03-12 ENCOUNTER — Other Ambulatory Visit (HOSPITAL_COMMUNITY): Payer: Self-pay

## 2024-03-12 NOTE — Telephone Encounter (Signed)
" ° ° ° °  I spoke to the daughter and she said they went ahead and got the eliquis  yesterday and they are working on getting the patient insurance again so no longer needs this  "

## 2024-03-16 ENCOUNTER — Ambulatory Visit (HOSPITAL_COMMUNITY): Payer: PRIVATE HEALTH INSURANCE | Admitting: Physician Assistant
